# Patient Record
Sex: Female | Born: 1995 | Race: Black or African American | Hispanic: No | Marital: Single | Smoking: Never smoker
Health system: Southern US, Community
[De-identification: ages and names within clinical notes are randomized; demographics above are authoritative.]

## PROBLEM LIST (undated history)

## (undated) DIAGNOSIS — E0500 Thyrotoxicosis with diffuse goiter without thyrotoxic crisis or storm: Secondary | ICD-10-CM

## (undated) DIAGNOSIS — R251 Tremor, unspecified: Secondary | ICD-10-CM

## (undated) DIAGNOSIS — R000 Tachycardia, unspecified: Secondary | ICD-10-CM

## (undated) DIAGNOSIS — E063 Autoimmune thyroiditis: Secondary | ICD-10-CM

## (undated) DIAGNOSIS — E0590 Thyrotoxicosis, unspecified without thyrotoxic crisis or storm: Secondary | ICD-10-CM

## (undated) DIAGNOSIS — R634 Abnormal weight loss: Secondary | ICD-10-CM

## (undated) DIAGNOSIS — R1013 Epigastric pain: Secondary | ICD-10-CM

## (undated) DIAGNOSIS — E039 Hypothyroidism, unspecified: Secondary | ICD-10-CM

## (undated) DIAGNOSIS — R454 Irritability and anger: Secondary | ICD-10-CM

## (undated) HISTORY — PX: NO PAST SURGERIES: SHX2092

## (undated) HISTORY — DX: Tremor, unspecified: R25.1

## (undated) HISTORY — DX: Tachycardia, unspecified: R00.0

## (undated) HISTORY — DX: Abnormal weight loss: R63.4

## (undated) HISTORY — DX: Irritability and anger: R45.4

## (undated) HISTORY — DX: Epigastric pain: R10.13

## (undated) HISTORY — DX: Autoimmune thyroiditis: E06.3

## (undated) HISTORY — DX: Thyrotoxicosis with diffuse goiter without thyrotoxic crisis or storm: E05.00

---
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
## 2013-01-15 ENCOUNTER — Ambulatory Visit (INDEPENDENT_AMBULATORY_CARE_PROVIDER_SITE_OTHER): Payer: BC Managed Care – PPO | Admitting: Physician Assistant

## 2013-01-15 VITALS — BP 106/69 | HR 64 | Temp 97.7°F | Resp 16 | Ht 62.5 in | Wt 124.0 lb

## 2013-01-15 DIAGNOSIS — Z0000 Encounter for general adult medical examination without abnormal findings: Secondary | ICD-10-CM

## 2013-01-15 DIAGNOSIS — R300 Dysuria: Secondary | ICD-10-CM

## 2013-01-15 LAB — POCT URINALYSIS DIPSTICK
Bilirubin, UA: NEGATIVE
Blood, UA: NEGATIVE
Glucose, UA: NEGATIVE
Ketones, UA: NEGATIVE
Leukocytes, UA: NEGATIVE
Nitrite, UA: NEGATIVE
Protein, UA: NEGATIVE
Spec Grav, UA: 1.025
Urobilinogen, UA: 0.2
pH, UA: 5.5

## 2013-01-15 LAB — POCT UA - MICROSCOPIC ONLY
Bacteria, U Microscopic: NEGATIVE
Casts, Ur, LPF, POC: NEGATIVE
Crystals, Ur, HPF, POC: NEGATIVE
Yeast, UA: NEGATIVE

## 2013-01-15 NOTE — Progress Notes (Signed)
  Subjective:    Patient ID: Angela Meyer, female    DOB: 07-31-1995, 17 y.o.   MRN: [id number]  HPI 17 year old female presents for CPE.  She needs this for school but does not have any paperwork that she needs completed.  States she received all of her vaccinations needed last year and only needs to "have gotten a physical."  Does see her Endocrinologist regularly for Grave's disease and DM x 1 - doing well.  Recently saw her GYN 2 weeks ago and had a pap as well as her depo injection - found out that she had Chlamydia and was treated.  Now is having some suprapubic cramping.  Is not currently sexually active and already has an appointment to f/u with GYN for TOC.   Otherwise doing well with no other concerns today.     Review of Systems  Constitutional: Negative for fever and chills.  Gastrointestinal: Positive for abdominal pain (suprapubic). Negative for nausea and vomiting.  Genitourinary: Positive for dysuria. Negative for frequency and vaginal discharge.  All other systems reviewed and are negative.       Objective:   Physical Exam  Constitutional: She is oriented to person, place, and time. She appears well-developed and well-nourished.  HENT:  Head: Normocephalic and atraumatic.  Right Ear: Hearing, tympanic membrane, external ear and ear canal normal.  Left Ear: Hearing, tympanic membrane, external ear and ear canal normal.  Mouth/Throat: Uvula is midline, oropharynx is clear and moist and mucous membranes are normal.  Eyes: Conjunctivae and EOM are normal. Pupils are equal, round, and reactive to light.  Neck: Thyromegaly present.  Cardiovascular: Normal rate, regular rhythm and normal heart sounds.   Pulmonary/Chest: Effort normal and breath sounds normal.  Neurological: She is alert and oriented to person, place, and time.  Psychiatric: She has a normal mood and affect. Her behavior is normal. Judgment and thought content normal.    Patient declined pelvic exam.     Results for orders placed in visit on 01/15/13  POCT UA - MICROSCOPIC ONLY      Result Value Range   WBC, Ur, HPF, POC 0-6     RBC, urine, microscopic 0-2     Bacteria, U Microscopic neg     Mucus, UA trace     Epithelial cells, urine per micros 0-6     Crystals, Ur, HPF, POC neg     Casts, Ur, LPF, POC neg     Yeast, UA neg    POCT URINALYSIS DIPSTICK      Result Value Range   Color, UA yellow     Clarity, UA clear     Glucose, UA neg     Bilirubin, UA neg     Ketones, UA neg     Spec Grav, UA 1.025     Blood, UA neg     pH, UA 5.5     Protein, UA neg     Urobilinogen, UA 0.2     Nitrite, UA neg     Leukocytes, UA Negative         Assessment & Plan:  Routine general medical examination at a health care facility  Dysuria - Plan: POCT UA - Microscopic Only, POCT urinalysis dipstick, Urine culture  Urine culture pending although UA clear today Recommend ibuprofen for abdominal cramping - follow up with GYN as planned if symptoms persist Letter printed for her to submit to school.  Follow up as needed.

## 2013-01-15 NOTE — Progress Notes (Signed)
  Subjective:    Patient ID: Angela Meyer, female    DOB: 1996-05-18, 17 y.o.   MRN: [id number]  HPI    Review of Systems  Gastrointestinal: Positive for abdominal pain.  Neurological: Positive for headaches.       Objective:   Physical Exam        Assessment & Plan:

## 2013-01-16 LAB — URINE CULTURE: Colony Count: 15000

## 2014-01-29 ENCOUNTER — Encounter (HOSPITAL_COMMUNITY): Payer: Self-pay | Admitting: Emergency Medicine

## 2014-01-29 ENCOUNTER — Emergency Department (HOSPITAL_COMMUNITY): Payer: BC Managed Care – PPO

## 2014-01-29 ENCOUNTER — Inpatient Hospital Stay (HOSPITAL_COMMUNITY)
Admission: EM | Admit: 2014-01-29 | Discharge: 2014-02-03 | DRG: 638 | Disposition: A | Discharge status: Home / Self Care | Payer: BC Managed Care – PPO | Attending: Internal Medicine | Admitting: Internal Medicine

## 2014-01-29 DIAGNOSIS — N10 Acute tubulo-interstitial nephritis: Secondary | ICD-10-CM | POA: Diagnosis present

## 2014-01-29 DIAGNOSIS — Z8249 Family history of ischemic heart disease and other diseases of the circulatory system: Secondary | ICD-10-CM | POA: Diagnosis not present

## 2014-01-29 DIAGNOSIS — N390 Urinary tract infection, site not specified: Secondary | ICD-10-CM

## 2014-01-29 DIAGNOSIS — A499 Bacterial infection, unspecified: Secondary | ICD-10-CM

## 2014-01-29 DIAGNOSIS — E1110 Type 2 diabetes mellitus with ketoacidosis without coma: Secondary | ICD-10-CM | POA: Diagnosis present

## 2014-01-29 DIAGNOSIS — E1010 Type 1 diabetes mellitus with ketoacidosis without coma: Principal | ICD-10-CM

## 2014-01-29 DIAGNOSIS — E860 Dehydration: Secondary | ICD-10-CM

## 2014-01-29 DIAGNOSIS — E063 Autoimmune thyroiditis: Secondary | ICD-10-CM | POA: Diagnosis present

## 2014-01-29 DIAGNOSIS — E0590 Thyrotoxicosis, unspecified without thyrotoxic crisis or storm: Secondary | ICD-10-CM

## 2014-01-29 DIAGNOSIS — R109 Unspecified abdominal pain: Secondary | ICD-10-CM | POA: Diagnosis not present

## 2014-01-29 DIAGNOSIS — R000 Tachycardia, unspecified: Secondary | ICD-10-CM | POA: Diagnosis present

## 2014-01-29 DIAGNOSIS — B9689 Other specified bacterial agents as the cause of diseases classified elsewhere: Secondary | ICD-10-CM | POA: Diagnosis present

## 2014-01-29 DIAGNOSIS — R7881 Bacteremia: Secondary | ICD-10-CM

## 2014-01-29 DIAGNOSIS — IMO0002 Reserved for concepts with insufficient information to code with codable children: Secondary | ICD-10-CM

## 2014-01-29 DIAGNOSIS — A498 Other bacterial infections of unspecified site: Secondary | ICD-10-CM | POA: Diagnosis present

## 2014-01-29 DIAGNOSIS — E1065 Type 1 diabetes mellitus with hyperglycemia: Secondary | ICD-10-CM

## 2014-01-29 DIAGNOSIS — Z833 Family history of diabetes mellitus: Secondary | ICD-10-CM | POA: Diagnosis not present

## 2014-01-29 DIAGNOSIS — N12 Tubulo-interstitial nephritis, not specified as acute or chronic: Secondary | ICD-10-CM | POA: Diagnosis present

## 2014-01-29 DIAGNOSIS — K141 Geographic tongue: Secondary | ICD-10-CM

## 2014-01-29 LAB — BASIC METABOLIC PANEL
ANION GAP: 19 — AB (ref 5–15)
Anion gap: 16 — ABNORMAL HIGH (ref 5–15)
BUN: 5 mg/dL — ABNORMAL LOW (ref 6–23)
BUN: 5 mg/dL — ABNORMAL LOW (ref 6–23)
CALCIUM: 8.7 mg/dL (ref 8.4–10.5)
CALCIUM: 8.3 mg/dL — AB (ref 8.4–10.5)
CO2: 15 mEq/L — ABNORMAL LOW (ref 19–32)
CO2: 16 mEq/L — ABNORMAL LOW (ref 19–32)
CREATININE: 0.5 mg/dL (ref 0.50–1.10)
CREATININE: 0.51 mg/dL (ref 0.50–1.10)
Chloride: 99 mEq/L (ref 96–112)
Chloride: 101 mEq/L (ref 96–112)
GFR calc non Af Amer: >90 mL/min (ref >90)
GFR calc non Af Amer: >90 mL/min (ref >90)
Glucose, Bld: 298 mg/dL — ABNORMAL HIGH (ref 70–99)
Glucose, Bld: 218 mg/dL — ABNORMAL HIGH (ref 70–99)
Potassium: 3.4 mEq/L — ABNORMAL LOW (ref 3.7–5.3)
Potassium: 3.4 mEq/L — ABNORMAL LOW (ref 3.7–5.3)
Sodium: 133 mEq/L — ABNORMAL LOW (ref 137–147)
Sodium: 133 mEq/L — ABNORMAL LOW (ref 137–147)

## 2014-01-29 LAB — CBC WITH DIFFERENTIAL/PLATELET
BASOS PCT: 0 % (ref 0–1)
Basophils Absolute: 0 10*3/uL (ref 0.0–0.1)
EOS ABS: 0 10*3/uL (ref 0.0–0.7)
Eosinophils Relative: 0 % (ref 0–5)
HEMATOCRIT: 41 % (ref 36.0–46.0)
Hemoglobin: 13.9 g/dL (ref 12.0–15.0)
Lymphocytes Relative: 4 % — ABNORMAL LOW (ref 12–46)
Lymphs Abs: 0.6 10*3/uL — ABNORMAL LOW (ref 0.7–4.0)
MCH: 28.4 pg (ref 26.0–34.0)
MCHC: 33.9 g/dL (ref 30.0–36.0)
MCV: 83.7 fL (ref 78.0–100.0)
MONO ABS: 2 10*3/uL — AB (ref 0.1–1.0)
Monocytes Relative: 15 % — ABNORMAL HIGH (ref 3–12)
Neutro Abs: 10.4 10*3/uL — ABNORMAL HIGH (ref 1.7–7.7)
Neutrophils Relative %: 81 % — ABNORMAL HIGH (ref 43–77)
Platelets: 265 10*3/uL (ref 150–400)
RBC: 4.9 MIL/uL (ref 3.87–5.11)
RDW: 12.8 % (ref 11.5–15.5)
WBC: 13 10*3/uL — ABNORMAL HIGH (ref 4.0–10.5)

## 2014-01-29 LAB — GLUCOSE, CAPILLARY
GLUCOSE-CAPILLARY: 216 mg/dL — AB (ref 70–99)
Glucose-Capillary: 185 mg/dL — ABNORMAL HIGH (ref 70–99)
Glucose-Capillary: 250 mg/dL — ABNORMAL HIGH (ref 70–99)
Glucose-Capillary: 327 mg/dL — ABNORMAL HIGH (ref 70–99)

## 2014-01-29 LAB — I-STAT VENOUS BLOOD GAS, ED
Acid-base deficit: 9 mmol/L — ABNORMAL HIGH (ref 0.0–2.0)
Bicarbonate: 15.3 mEq/L — ABNORMAL LOW (ref 20.0–24.0)
O2 SAT: 80 %
PCO2 VEN: 29.4 mmHg — AB (ref 45.0–50.0)
PH VEN: 7.325 — AB (ref 7.250–7.300)
TCO2: 16 mmol/L (ref 0–100)
pO2, Ven: 47 mmHg — ABNORMAL HIGH (ref 30.0–45.0)

## 2014-01-29 LAB — COMPREHENSIVE METABOLIC PANEL
ALBUMIN: 4 g/dL (ref 3.5–5.2)
ALT: 13 U/L (ref 0–35)
AST: 14 U/L (ref 0–37)
Alkaline Phosphatase: 80 U/L (ref 39–117)
Anion gap: 24 — ABNORMAL HIGH (ref 5–15)
BUN: 6 mg/dL (ref 6–23)
CHLORIDE: 93 meq/L — AB (ref 96–112)
CO2: 17 mEq/L — ABNORMAL LOW (ref 19–32)
CREATININE: 0.65 mg/dL (ref 0.50–1.10)
Calcium: 9.5 mg/dL (ref 8.4–10.5)
GFR calc Af Amer: >90 mL/min (ref >90)
GFR calc non Af Amer: >90 mL/min (ref >90)
Glucose, Bld: 268 mg/dL — ABNORMAL HIGH (ref 70–99)
Potassium: 3.8 mEq/L (ref 3.7–5.3)
Sodium: 134 mEq/L — ABNORMAL LOW (ref 137–147)
TOTAL PROTEIN: 8.2 g/dL (ref 6.0–8.3)
Total Bilirubin: 0.7 mg/dL (ref 0.3–1.2)

## 2014-01-29 LAB — CBC
HCT: 36 % (ref 36.0–46.0)
HEMATOCRIT: 33 % — AB (ref 36.0–46.0)
Hemoglobin: 12.2 g/dL (ref 12.0–15.0)
Hemoglobin: 11.2 g/dL — ABNORMAL LOW (ref 12.0–15.0)
MCH: 28.2 pg (ref 26.0–34.0)
MCH: 27.6 pg (ref 26.0–34.0)
MCHC: 33.9 g/dL (ref 30.0–36.0)
MCHC: 33.9 g/dL (ref 30.0–36.0)
MCV: 83.1 fL (ref 78.0–100.0)
MCV: 81.3 fL (ref 78.0–100.0)
Platelets: 228 10*3/uL (ref 150–400)
Platelets: 228 10*3/uL (ref 150–400)
RBC: 4.33 MIL/uL (ref 3.87–5.11)
RBC: 4.06 MIL/uL (ref 3.87–5.11)
RDW: 12.8 % (ref 11.5–15.5)
RDW: 12.6 % (ref 11.5–15.5)
WBC: 9.4 10*3/uL (ref 4.0–10.5)
WBC: 10.1 10*3/uL (ref 4.0–10.5)

## 2014-01-29 LAB — BLOOD GAS, VENOUS
Acid-base deficit: 8.3 mmol/L — ABNORMAL HIGH (ref 0.0–2.0)
Bicarbonate: 15.8 mEq/L — ABNORMAL LOW (ref 20.0–24.0)
Drawn by: 22112
O2 SAT: 98.3 %
PCO2 VEN: 27.2 mmHg — AB (ref 45.0–50.0)
Patient temperature: 98.6
TCO2: 16.6 mmol/L (ref 0–100)
pH, Ven: 7.382 — ABNORMAL HIGH (ref 7.250–7.300)
pO2, Ven: 108 mmHg — ABNORMAL HIGH (ref 30.0–45.0)

## 2014-01-29 LAB — URINALYSIS, ROUTINE W REFLEX MICROSCOPIC
Bilirubin Urine: NEGATIVE
Ketones, ur: >80 mg/dL — AB
NITRITE: POSITIVE — AB
PH: 5.5 (ref 5.0–8.0)
Protein, ur: 100 mg/dL — AB
Specific Gravity, Urine: 1.03 (ref 1.005–1.030)
Urobilinogen, UA: 0.2 mg/dL (ref 0.0–1.0)

## 2014-01-29 LAB — URINE MICROSCOPIC-ADD ON

## 2014-01-29 LAB — I-STAT CG4 LACTIC ACID, ED: Lactic Acid, Venous: 1.96 mmol/L (ref 0.5–2.2)

## 2014-01-29 LAB — POC URINE PREG, ED: Preg Test, Ur: NEGATIVE

## 2014-01-29 LAB — MRSA PCR SCREENING: MRSA by PCR: NEGATIVE

## 2014-01-29 MED ORDER — DEXTROSE-NACL 5-0.45 % IV SOLN
INTRAVENOUS | Status: DC
  Administered 2014-01-30 (×2): via INTRAVENOUS

## 2014-01-29 MED ORDER — IBUPROFEN 400 MG PO TABS
600.0000 mg | ORAL_TABLET | Freq: Once | ORAL | Status: AC
  Administered 2014-01-29: 600 mg via ORAL
  Filled 2014-01-29 (×2): qty 1

## 2014-01-29 MED ORDER — POTASSIUM CHLORIDE 10 MEQ/100ML IV SOLN
10.0000 meq | INTRAVENOUS | Status: AC
  Administered 2014-01-29 (×2): 10 meq via INTRAVENOUS
  Filled 2014-01-29: qty 100

## 2014-01-29 MED ORDER — SODIUM CHLORIDE 0.9 % IV SOLN
INTRAVENOUS | Status: AC
  Administered 2014-01-29: 1.6 [IU]/h via INTRAVENOUS
  Administered 2014-01-30: 3.2 [IU]/h via INTRAVENOUS
  Administered 2014-01-30: 4.8 [IU]/h via INTRAVENOUS
  Administered 2014-01-30: 4.4 [IU]/h via INTRAVENOUS
  Filled 2014-01-29 (×2): qty 1

## 2014-01-29 MED ORDER — ACETAMINOPHEN 325 MG PO TABS
650.0000 mg | ORAL_TABLET | Freq: Four times a day (QID) | ORAL | Status: DC | PRN
  Administered 2014-01-29 – 2014-02-01 (×8): 650 mg via ORAL
  Filled 2014-01-29 (×8): qty 2

## 2014-01-29 MED ORDER — SODIUM CHLORIDE 0.9 % IV SOLN
INTRAVENOUS | Status: DC
  Administered 2014-01-29: 125 mL/h via INTRAVENOUS

## 2014-01-29 MED ORDER — SODIUM CHLORIDE 0.9 % IV SOLN: INTRAVENOUS | Status: DC

## 2014-01-29 MED ORDER — HEPARIN SODIUM (PORCINE) 5000 UNIT/ML IJ SOLN
5000.0000 [IU] | Freq: Three times a day (TID) | INTRAMUSCULAR | Status: DC
  Administered 2014-01-29 – 2014-02-02 (×12): 5000 [IU] via SUBCUTANEOUS
  Filled 2014-01-29 (×16): qty 1

## 2014-01-29 MED ORDER — DEXTROSE 5 % IV SOLN
1.0000 g | INTRAVENOUS | Status: DC
  Administered 2014-01-30: 1 g via INTRAVENOUS
  Filled 2014-01-29 (×2): qty 10

## 2014-01-29 MED ORDER — DEXTROSE-NACL 5-0.45 % IV SOLN
INTRAVENOUS | Status: DC
  Administered 2014-01-29: 75 mL/h via INTRAVENOUS

## 2014-01-29 MED ORDER — ACETAMINOPHEN 325 MG PO TABS
325.0000 mg | ORAL_TABLET | Freq: Once | ORAL | Status: AC
  Administered 2014-01-29: 325 mg via ORAL
  Filled 2014-01-29: qty 1

## 2014-01-29 MED ORDER — SODIUM CHLORIDE 0.9 % IV SOLN
Freq: Once | INTRAVENOUS | Status: AC
  Administered 2014-01-29: 15:00:00 via INTRAVENOUS

## 2014-01-29 MED ORDER — DEXTROSE 5 % IV SOLN
1.0000 g | Freq: Once | INTRAVENOUS | Status: AC
  Administered 2014-01-29: 1 g via INTRAVENOUS
  Filled 2014-01-29: qty 10

## 2014-01-29 MED ORDER — DEXTROSE 50 % IV SOLN: 25.0000 mL | INTRAVENOUS | Status: DC | PRN

## 2014-01-29 MED ORDER — ONDANSETRON HCL 4 MG/2ML IJ SOLN
4.0000 mg | Freq: Once | INTRAMUSCULAR | Status: AC
  Administered 2014-01-29: 4 mg via INTRAVENOUS
  Filled 2014-01-29: qty 2

## 2014-01-29 NOTE — ED Notes (Signed)
Admitting Physician at the bedside.  

## 2014-01-29 NOTE — H&P (Addendum)
[facility] History and Physical  Angela A Potash JJO:[id number] DOB: 1996/03/12 DOA: 01/29/2014  Referring physician: EDP PCP: Hoyle SauerAVVA,RAVISANKAR R, MD   Chief Complaint: N/V   HPI: Angela Meyer is a 18 y.o. female h/o DM1, presents to the ED with N/V and abdominal pain.  Symptoms onset 3 days ago.  She has had multiple episodes of vomiting, vomit is NBNB.  She reports that her BGLs have become elevated over the last couple of days despite no changes to medications and she dosent understand why as they have been well controlled for the past 6 months.  Symptoms associated with abdominal pain and fever.  Review of Systems: Systems reviewed.  As above, otherwise negative  Past Medical History  Diagnosis Date  . Goiter with hyperthyroidism   . Weight loss, abnormal   . Fetal tachycardia before the onset of labor   . Tremor   . Thyroiditis, autoimmune   . Dyspepsia   . Irritability   . Thyrotoxicosis with diffuse goiter   . Chronic autoimmune thyroiditis   . Tachycardia   . Tremor   . Weight loss   . Irritability   . Diabetes mellitus    History reviewed. No pertinent past surgical history. Social History:  reports that she has never smoked. She has never used smokeless tobacco. She reports that she does not drink alcohol or use illicit drugs.  No Known Allergies  Family History  Problem Relation Age of Onset  . Diabetes Father   . Diabetes Paternal Aunt   . Thyroid disease Maternal Grandmother   . Heart disease Maternal Grandfather      Prior to Admission medications   Medication Sig Start Date End Date Taking? Authorizing Provider  Insulin Aspart (NOVOLOG FLEXPEN [place]) Inject 0-15 Units into the skin 4 (four) times daily as needed (blood sugar).    Yes Historical Provider, MD  insulin glargine (LANTUS) 100 UNIT/ML injection Inject 14 Units into the skin at bedtime.    Yes Historical Provider, MD  medroxyPROGESTERone (DEPO-PROVERA) 150 MG/ML injection Inject 150 mg  into the muscle every 3 (three) months.   Yes Historical Provider, MD   Physical Exam: Filed Vitals:   01/29/14 1815  BP: 116/77  Pulse:   Temp:   Resp: 13    BP 116/77  Pulse 108  Temp(Src) 99.5 F (37.5 C) (Oral)  Resp 13  Ht 5\' 2"  (1.575 m)  Wt 54.432 kg (120 lb)  BMI 21.94 kg/m2  SpO2 100%  General Appearance:    Alert, oriented, no distress, appears stated age  Head:    Normocephalic, atraumatic  Eyes:    PERRL, EOMI, sclera non-icteric        Nose:   Nares without drainage or epistaxis. Mucosa, turbinates normal  Throat:   Moist mucous membranes. Oropharynx without erythema or exudate.  Neck:   Supple. No carotid bruits.  No thyromegaly.  No lymphadenopathy.   Back:     No CVA tenderness, no spinal tenderness  Lungs:     Clear to auscultation bilaterally, without wheezes, rhonchi or rales  Chest wall:    No tenderness to palpitation  Heart:    Regular rate and rhythm without murmurs, gallops, rubs  Abdomen:     Soft, non-tender, nondistended, normal bowel sounds, no organomegaly  Genitalia:    deferred  Rectal:    deferred  Extremities:   No clubbing, cyanosis or edema.  Pulses:   2+ and symmetric all extremities  Skin:   Skin color,  texture, turgor normal, no rashes or lesions  Lymph nodes:   Cervical, supraclavicular, and axillary nodes normal  Neurologic:   CNII-XII intact. Normal strength, sensation and reflexes      throughout    Labs on Admission:  Basic Metabolic Panel:  Recent Labs Lab 01/29/14 1510  NA 134*  K 3.8  CL 93*  CO2 17*  GLUCOSE 268*  BUN 6  CREATININE 0.65  CALCIUM 9.5   Liver Function Tests:  Recent Labs Lab 01/29/14 1510  AST 14  ALT 13  ALKPHOS 80  BILITOT 0.7  PROT 8.2  ALBUMIN 4.0   No results found for this basename: LIPASE, AMYLASE,  in the last 168 hours No results found for this basename: AMMONIA,  in the last 168 hours CBC:  Recent Labs Lab 01/29/14 1510  WBC 13.0*  NEUTROABS 10.4*  HGB 13.9  HCT 41.0   MCV 83.7  PLT 265   Cardiac Enzymes: No results found for this basename: CKTOTAL, CKMB, CKMBINDEX, TROPONINI,  in the last 168 hours  BNP (last 3 results) No results found for this basename: PROBNP,  in the last 8760 hours CBG: No results found for this basename: GLUCAP,  in the last 168 hours  Radiological Exams on Admission: Dg Chest 2 View  01/29/2014   CLINICAL DATA:  Abdominal pain. Nausea and vomiting. Fever. Possible arrhythmia. Diabetic.  EXAM: CHEST  2 VIEW  COMPARISON:  03/29/2007  FINDINGS: The heart size and mediastinal contours are within normal limits. Both lungs are clear. The visualized skeletal structures are unremarkable.  IMPRESSION: No active cardiopulmonary disease.  Improved appearance from priors.   Electronically Signed   By: Davonna Belling M.D.   On: 01/29/2014 15:39    EKG: Independently reviewed.  Assessment/Plan Principal Problem:   DKA, type 1 Active Problems:   Diabetes type 1, uncontrolled   UTI (lower urinary tract infection)     1. DKA type 1 - DKA occuring secondary to increased insulin requirement in setting of UTI.  On DKA pathway, insulin gtt per protocol, replace lytes, labs per protocol.  Explained to patient about how UTI was causing increased insulin requirements and her to go into DKA. 2. UTI - rocephin for UTI, tylenol for fever, urine culture pending.  Monitor tachycardia on tele monitor.    Code Status: Full Code  Family Communication: Family at bedside Disposition Plan: Admit to inpatient   Time spent: 70 min  Daja Shuping M. [facility] Pager [phone]  If 7AM-7PM, please contact the day team taking care of the patient [email or web address] Password [email or web address] 01/29/2014, 7:25 PM

## 2014-01-29 NOTE — ED Notes (Signed)
Pt c/o pain in abdomen with N/V onset Friday. Pt reports history of diabetes. Pt reports fever at home. Pt denies change in diet.

## 2014-01-29 NOTE — ED Notes (Signed)
Attempted to call report x 1  

## 2014-01-29 NOTE — ED Notes (Signed)
Lab results given to EDP. 

## 2014-01-29 NOTE — ED Notes (Signed)
CBG 217  

## 2014-01-29 NOTE — ED Provider Notes (Signed)
Medical screening examination/treatment/procedure(s) were conducted as a shared visit with non-physician practitioner(s) and myself.  I personally evaluated the patient during the encounter.  CRITICAL CARE Performed by: Lyanne CoAMPOS,Beda Dula M Total critical care time: 30 Critical care time was exclusive of separately billable procedures and treating other patients. Critical care was necessary to treat or prevent imminent or life-threatening deterioration. Critical care was time spent personally by me on the following activities: development of treatment plan with patient and/or surrogate as well as nursing, discussions with consultants, evaluation of patient's response to treatment, examination of patient, obtaining history from patient or surrogate, ordering and performing treatments and interventions, ordering and review of laboratory studies, ordering and review of radiographic studies, pulse oximetry and re-evaluation of patient's condition.  Pt will be admitted for UTI and DKA. Will start on insulin gtt at this time. Urine culture pending. Admit to step down   Lyanne CoKevin M Silvestre [place], MD 01/29/14 [phone]

## 2014-01-29 NOTE — ED Provider Notes (Signed)
CSN: [id number]     Arrival date & time 01/29/14  1422 History   First MD Initiated Contact with Patient 01/29/14 1455     Chief Complaint  Patient presents with  . Abdominal Pain  . Emesis     (Consider location/radiation/quality/duration/timing/severity/associated sxs/prior Treatment) Patient is a 18 y.o. female presenting with vomiting. The history is provided by the patient. No language interpreter was used.  Emesis Severity:  Moderate Duration:  3 days Timing:  Constant Number of daily episodes:  Multiple Quality:  Undigested food Able to tolerate:  Liquids Progression:  Worsening Chronicity:  New Recent urination:  Normal Relieved by:  Nothing Worsened by:  Nothing tried Ineffective treatments:  None tried Associated symptoms: abdominal pain and fever   Associated symptoms: no diarrhea   Risk factors: diabetes   Risk factors: no alcohol use   Pt has a history of diabetes.   Pt has had recent increased urination.   Pt reports fever and vomitting today.  No vaginal discharge no gyn problems.    Past Medical History  Diagnosis Date  . Goiter with hyperthyroidism   . Weight loss, abnormal   . Fetal tachycardia before the onset of labor   . Tremor   . Thyroiditis, autoimmune   . Dyspepsia   . Irritability   . Thyrotoxicosis with diffuse goiter   . Chronic autoimmune thyroiditis   . Tachycardia   . Tremor   . Weight loss   . Irritability   . Diabetes mellitus    History reviewed. No pertinent past surgical history. Family History  Problem Relation Age of Onset  . Diabetes Father   . Diabetes Paternal Aunt   . Thyroid disease Maternal Grandmother   . Heart disease Maternal Grandfather    History  Substance Use Topics  . Smoking status: Never Smoker   . Smokeless tobacco: Never Used  . Alcohol Use: No   OB History   Grav Para Term Preterm Abortions TAB SAB Ect Mult Living                 Review of Systems  Gastrointestinal: Positive for vomiting and  abdominal pain. Negative for diarrhea.  All other systems reviewed and are negative.     Allergies  Review of patient's allergies indicates no known allergies.  Home Medications   Prior to Admission medications   Medication Sig Start Date End Date Taking? Authorizing Provider  Insulin Aspart (NOVOLOG FLEXPEN [place]) Inject 0-15 Units into the skin 4 (four) times daily as needed (blood sugar).    Yes Historical Provider, MD  insulin glargine (LANTUS) 100 UNIT/ML injection Inject 14 Units into the skin at bedtime.    Yes Historical Provider, MD  medroxyPROGESTERone (DEPO-PROVERA) 150 MG/ML injection Inject 150 mg into the muscle every 3 (three) months.   Yes Historical Provider, MD   BP 121/76  Pulse 108  Temp(Src) 99.5 F (37.5 C) (Oral)  Resp 26  Ht 5\' 2"  (1.575 m)  Wt 120 lb (54.432 kg)  BMI 21.94 kg/m2  SpO2 100% Physical Exam  Nursing note and vitals reviewed. Constitutional: She appears well-developed and well-nourished.  HENT:  Head: Normocephalic.  Right Ear: External ear normal.  Nose: Nose normal.  Mouth/Throat: Oropharynx is clear and moist.  Eyes: EOM are normal. Pupils are equal, round, and reactive to light.  Neck: Neck supple.  Cardiovascular: Normal rate and normal heart sounds.   Pulmonary/Chest: Effort normal.  Abdominal: Soft.  Musculoskeletal: Normal range of motion.  Skin: Skin  is warm.  Psychiatric: She has a normal mood and affect.    ED Course  Procedures (including critical care time) Labs Review Labs Reviewed  CBC WITH DIFFERENTIAL - Abnormal; Notable for the following:    WBC 13.0 (*)    Neutrophils Relative % 81 (*)    Neutro Abs 10.4 (*)    Lymphocytes Relative 4 (*)    Lymphs Abs 0.6 (*)    Monocytes Relative 15 (*)    Monocytes Absolute 2.0 (*)    All other components within normal limits  COMPREHENSIVE METABOLIC PANEL - Abnormal; Notable for the following:    Sodium 134 (*)    Chloride 93 (*)    CO2 17 (*)    Glucose, Bld 268 (*)     Anion gap 24 (*)    All other components within normal limits  URINALYSIS, ROUTINE W REFLEX MICROSCOPIC - Abnormal; Notable for the following:    APPearance CLOUDY (*)    Glucose, UA >1000 (*)    Hgb urine dipstick LARGE (*)    Ketones, ur >80 (*)    Protein, ur 100 (*)    Nitrite POSITIVE (*)    Leukocytes, UA SMALL (*)    All other components within normal limits  URINE MICROSCOPIC-ADD ON - Abnormal; Notable for the following:    Squamous Epithelial / LPF FEW (*)    Bacteria, UA FEW (*)    All other components within normal limits  CULTURE, BLOOD (ROUTINE X 2)  CULTURE, BLOOD (ROUTINE X 2)  URINE CULTURE  I-STAT CG4 LACTIC ACID, ED  POC URINE PREG, ED    Imaging Review Dg Chest 2 View  01/29/2014   CLINICAL DATA:  Abdominal pain. Nausea and vomiting. Fever. Possible arrhythmia. Diabetic.  EXAM: CHEST  2 VIEW  COMPARISON:  03/29/2007  FINDINGS: The heart size and mediastinal contours are within normal limits. Both lungs are clear. The visualized skeletal structures are unremarkable.  IMPRESSION: No active cardiopulmonary disease.  Improved appearance from priors.   Electronically Signed   By: Davonna BellingJohn  Curnes M.D.   On: 01/29/2014 15:39     EKG Interpretation None      MDM   Final diagnoses:  None    Pt given Iv fluids,  Rocephin Iv.  Lactic acid returned at 1.96.  Ua shows ketones 80 .    Pt is nitrate positive with 21-50 wbc's  Wbc 13.0  Cmet shows a sodioum of 134  Cl 93 and Co2 of 17.   Anion gap of 24.     Pt reports decreased nausea. Temp decreased with tylenol.      Pt seen by Dr. Patria Maneampos.   Pt started on D5 and insulin.     I spoke to Dr. Julian ReilGardner who will admit.    Lonia SkinnerLeslie K MaywoodSofia, PA-C 01/29/14 1914

## 2014-01-29 NOTE — ED Notes (Signed)
Transporting patient up to 3S.

## 2014-01-30 DIAGNOSIS — R7881 Bacteremia: Secondary | ICD-10-CM | POA: Diagnosis present

## 2014-01-30 DIAGNOSIS — N12 Tubulo-interstitial nephritis, not specified as acute or chronic: Secondary | ICD-10-CM | POA: Diagnosis present

## 2014-01-30 LAB — BASIC METABOLIC PANEL
ANION GAP: 13 (ref 5–15)
ANION GAP: 12 (ref 5–15)
ANION GAP: 16 — AB (ref 5–15)
Anion gap: 14 (ref 5–15)
Anion gap: 14 (ref 5–15)
Anion gap: 13 (ref 5–15)
BUN: 4 mg/dL — ABNORMAL LOW (ref 6–23)
BUN: 4 mg/dL — ABNORMAL LOW (ref 6–23)
BUN: 3 mg/dL — ABNORMAL LOW (ref 6–23)
BUN: 3 mg/dL — ABNORMAL LOW (ref 6–23)
BUN: 3 mg/dL — ABNORMAL LOW (ref 6–23)
BUN: 3 mg/dL — ABNORMAL LOW (ref 6–23)
CALCIUM: 8.4 mg/dL (ref 8.4–10.5)
CALCIUM: 8 mg/dL — AB (ref 8.4–10.5)
CHLORIDE: 102 meq/L (ref 96–112)
CHLORIDE: 100 meq/L (ref 96–112)
CO2: 19 meq/L (ref 19–32)
CO2: 20 mEq/L (ref 19–32)
CO2: 18 mEq/L — ABNORMAL LOW (ref 19–32)
CO2: 19 mEq/L (ref 19–32)
CO2: 19 mEq/L (ref 19–32)
CO2: 18 mEq/L — ABNORMAL LOW (ref 19–32)
CREATININE: 0.57 mg/dL (ref 0.50–1.10)
CREATININE: 0.62 mg/dL (ref 0.50–1.10)
Calcium: 8.6 mg/dL (ref 8.4–10.5)
Calcium: 8.4 mg/dL (ref 8.4–10.5)
Calcium: 8.4 mg/dL (ref 8.4–10.5)
Calcium: 8.3 mg/dL — ABNORMAL LOW (ref 8.4–10.5)
Chloride: 103 mEq/L (ref 96–112)
Chloride: 103 mEq/L (ref 96–112)
Chloride: 103 mEq/L (ref 96–112)
Chloride: 98 mEq/L (ref 96–112)
Creatinine, Ser: 0.56 mg/dL (ref 0.50–1.10)
Creatinine, Ser: 0.54 mg/dL (ref 0.50–1.10)
Creatinine, Ser: 0.58 mg/dL (ref 0.50–1.10)
Creatinine, Ser: 0.59 mg/dL (ref 0.50–1.10)
GFR calc Af Amer: >90 mL/min (ref >90)
GFR calc Af Amer: >90 mL/min (ref >90)
GFR calc Af Amer: >90 mL/min (ref >90)
GFR calc non Af Amer: >90 mL/min (ref >90)
GFR calc non Af Amer: >90 mL/min (ref >90)
GFR calc non Af Amer: >90 mL/min (ref >90)
GFR calc non Af Amer: >90 mL/min (ref >90)
GFR calc non Af Amer: >90 mL/min (ref >90)
GFR calc non Af Amer: >90 mL/min (ref >90)
Glucose, Bld: 165 mg/dL — ABNORMAL HIGH (ref 70–99)
Glucose, Bld: 113 mg/dL — ABNORMAL HIGH (ref 70–99)
Glucose, Bld: 152 mg/dL — ABNORMAL HIGH (ref 70–99)
Glucose, Bld: 168 mg/dL — ABNORMAL HIGH (ref 70–99)
Glucose, Bld: 250 mg/dL — ABNORMAL HIGH (ref 70–99)
Glucose, Bld: 189 mg/dL — ABNORMAL HIGH (ref 70–99)
POTASSIUM: 3.3 meq/L — AB (ref 3.7–5.3)
POTASSIUM: 3.2 meq/L — AB (ref 3.7–5.3)
Potassium: 3.3 mEq/L — ABNORMAL LOW (ref 3.7–5.3)
Potassium: 3.7 mEq/L (ref 3.7–5.3)
Potassium: 3.8 mEq/L (ref 3.7–5.3)
Potassium: 3 mEq/L — ABNORMAL LOW (ref 3.7–5.3)
SODIUM: 132 meq/L — AB (ref 137–147)
Sodium: 135 mEq/L — ABNORMAL LOW (ref 137–147)
Sodium: 137 mEq/L (ref 137–147)
Sodium: 135 mEq/L — ABNORMAL LOW (ref 137–147)
Sodium: 133 mEq/L — ABNORMAL LOW (ref 137–147)
Sodium: 132 mEq/L — ABNORMAL LOW (ref 137–147)

## 2014-01-30 LAB — GLUCOSE, CAPILLARY
GLUCOSE-CAPILLARY: 217 mg/dL — AB (ref 70–99)
GLUCOSE-CAPILLARY: 122 mg/dL — AB (ref 70–99)
GLUCOSE-CAPILLARY: 156 mg/dL — AB (ref 70–99)
GLUCOSE-CAPILLARY: 154 mg/dL — AB (ref 70–99)
GLUCOSE-CAPILLARY: 204 mg/dL — AB (ref 70–99)
GLUCOSE-CAPILLARY: 191 mg/dL — AB (ref 70–99)
Glucose-Capillary: 141 mg/dL — ABNORMAL HIGH (ref 70–99)
Glucose-Capillary: 145 mg/dL — ABNORMAL HIGH (ref 70–99)
Glucose-Capillary: 145 mg/dL — ABNORMAL HIGH (ref 70–99)
Glucose-Capillary: 145 mg/dL — ABNORMAL HIGH (ref 70–99)
Glucose-Capillary: 159 mg/dL — ABNORMAL HIGH (ref 70–99)
Glucose-Capillary: 162 mg/dL — ABNORMAL HIGH (ref 70–99)
Glucose-Capillary: 162 mg/dL — ABNORMAL HIGH (ref 70–99)
Glucose-Capillary: 166 mg/dL — ABNORMAL HIGH (ref 70–99)
Glucose-Capillary: 170 mg/dL — ABNORMAL HIGH (ref 70–99)
Glucose-Capillary: 171 mg/dL — ABNORMAL HIGH (ref 70–99)
Glucose-Capillary: 181 mg/dL — ABNORMAL HIGH (ref 70–99)
Glucose-Capillary: 200 mg/dL — ABNORMAL HIGH (ref 70–99)

## 2014-01-30 MED ORDER — INSULIN ASPART 100 UNIT/ML ~~LOC~~ SOLN: 0.0000 [IU] | Freq: Every day | SUBCUTANEOUS | Status: DC

## 2014-01-30 MED ORDER — POTASSIUM CHLORIDE 10 MEQ/100ML IV SOLN
10.0000 meq | INTRAVENOUS | Status: AC
  Administered 2014-01-30 – 2014-01-31 (×4): 10 meq via INTRAVENOUS
  Filled 2014-01-30 (×4): qty 100

## 2014-01-30 MED ORDER — POTASSIUM CHLORIDE 10 MEQ/100ML IV SOLN
10.0000 meq | INTRAVENOUS | Status: AC
  Administered 2014-01-30 (×3): 10 meq via INTRAVENOUS
  Filled 2014-01-30: qty 100

## 2014-01-30 MED ORDER — SODIUM CHLORIDE 0.9 % IV BOLUS (SEPSIS)
500.0000 mL | Freq: Once | INTRAVENOUS | Status: AC
  Administered 2014-01-30: 500 mL via INTRAVENOUS

## 2014-01-30 MED ORDER — KETOROLAC TROMETHAMINE 15 MG/ML IJ SOLN
15.0000 mg | Freq: Four times a day (QID) | INTRAMUSCULAR | Status: DC | PRN
  Administered 2014-01-30 – 2014-01-31 (×2): 15 mg via INTRAVENOUS
  Filled 2014-01-30 (×3): qty 1

## 2014-01-30 MED ORDER — SODIUM CHLORIDE 0.9 % IV SOLN
INTRAVENOUS | Status: DC
  Administered 2014-01-30 – 2014-01-31 (×2): via INTRAVENOUS
  Administered 2014-01-31: 100 mL via INTRAVENOUS
  Administered 2014-02-01: via INTRAVENOUS

## 2014-01-30 MED ORDER — INSULIN GLARGINE 100 UNIT/ML ~~LOC~~ SOLN: 14.0000 [IU] | Freq: Every day | SUBCUTANEOUS | Status: DC

## 2014-01-30 MED ORDER — INSULIN ASPART 100 UNIT/ML ~~LOC~~ SOLN
0.0000 [IU] | Freq: Three times a day (TID) | SUBCUTANEOUS | Status: DC
  Administered 2014-01-30: 5 [IU] via SUBCUTANEOUS

## 2014-01-30 MED ORDER — INSULIN GLARGINE 100 UNIT/ML ~~LOC~~ SOLN
14.0000 [IU] | Freq: Once | SUBCUTANEOUS | Status: AC
  Administered 2014-01-30: 14 [IU] via SUBCUTANEOUS
  Filled 2014-01-30: qty 0.14

## 2014-01-30 MED ORDER — INSULIN GLARGINE 100 UNIT/ML ~~LOC~~ SOLN
14.0000 [IU] | SUBCUTANEOUS | Status: DC
  Administered 2014-01-31 – 2014-02-01 (×2): 14 [IU] via SUBCUTANEOUS
  Filled 2014-01-30 (×2): qty 0.14

## 2014-01-30 MED ORDER — MORPHINE SULFATE 2 MG/ML IJ SOLN
2.0000 mg | Freq: Once | INTRAMUSCULAR | Status: AC
  Administered 2014-01-30: 2 mg via INTRAVENOUS
  Filled 2014-01-30: qty 1

## 2014-01-30 MED ORDER — ONDANSETRON HCL 4 MG/2ML IJ SOLN
4.0000 mg | Freq: Four times a day (QID) | INTRAMUSCULAR | Status: DC | PRN
  Administered 2014-01-30: 4 mg via INTRAVENOUS
  Filled 2014-01-30: qty 2

## 2014-01-30 NOTE — Progress Notes (Addendum)
Inpatient Diabetes Program Recommendations  AACE/ADA: New Consensus Statement on Inpatient Glycemic Control (2013)  Target Ranges:  Prepandial:   less than 140 mg/dL      Peak postprandial:   less than 180 mg/dL (1-2 hours)      Critically ill patients:  140 - 180 mg/dL   Reason for Assessment:  Type 1 diabetes/DKA  Diabetes history: Type 1 Diabetes Outpatient Diabetes medications: Lantus 14 units daily, Novolog flexpen with meals Current orders for Inpatient glycemic control:  Patient remains on IV insulin.  Consider A1C to determine pre-hospitalization glycemic control.  Will follow.  Angela MeagerJenny Dalanie Kisner, RN, BC-ADM Inpatient Diabetes Coordinator Pager [phone]   Spoke briefly to patient.  She states that her blood sugars are usually good (Less than 190 mg/dL).  She cannot remember what her last A1c was but remembers that it was good.  She see's Dr. Felipa Meyer for her diabetes.  Will follow.

## 2014-01-30 NOTE — Progress Notes (Signed)
[facility]  Angela Meyer ZOX:[id number] DOB: September 18, 1995 DOA: 01/29/2014 PCP: Hoyle Sauer, MD  Brief narrative: 18 y.o. female h/o DM1 who presented to the ED with N/V and abdominal pain. Symptoms onset 3 days prior to presentation. She has had multiple episodes of vomiting. She reported that her CBGs had become elevated over the previous several days despite no changes to medications and she did not understand why as they had been well controlled for the past 6 months. Symptoms were also associated with abdominal pain and fever.   In the ER her serum glucose was 298 with and AG of 24 and normal serum lactate. Her UA was consistent with a urinary tract infection.  HPI/Subjective: Endorsing suprapubic pain - using heating pad for relief.   Assessment/Plan:   DKA - Uncontrolled type 1 DM -etiology is underlying significant infection  -16:00 > AG closed so transition to Lantus with SSI and begin carb modified diet  Pyelonephritis -continue empiric anbxs -follow up on all cx's  Gram-negative bacteria -hemodynamically stable -see above  Hyperthyroidism / Thyroiditis, autoimmune -followed OP by Dr. Fransico Michael previously -last TSH in 2012 was >10 - records from Dr. Vicente Males office not available -consider repeat while here noting not on any thyroid replacement and in 2013 left endocrinologists office before being seen  Dehydration -cont IVFs   DVT prophylaxis: SQ Heparin Code Status: Full Family Communication: Mother at bedside Disposition Plan/Expected LOS: SDU  Consultants: None  Procedures: None  Cultures: Urine cx 8/9 >>> Blood cx x 2 8/9 >>> gram neg rods  Antibiotics: Rocephin 8/9 >>>  Objective: Blood pressure 110/71, pulse 107, temperature 101.4 F (38.6 C), temperature source Oral, resp. rate 19, height 5\' 2"  (1.575 m), weight 54.432 kg (120 lb), SpO2 99.00%.  Intake/Output Summary (Last 24 hours) at 01/30/14  1759 Last data filed at 01/30/14 1552  Gross per 24 hour  Intake 3084.52 ml  Output   2225 ml  Net 859.52 ml   Exam: Gen: No acute respiratory distress Chest: Clear to auscultation bilaterally without wheezes, rhonchi or crackles, room air Cardiac: Regular rate and rhythm, S1-S2, no rubs murmurs or gallops, no peripheral edema Abdomen: Soft tender over suprapubic area- nondistended without obvious hepatosplenomegaly, no ascites Extremities: Symmetrical in appearance without cyanosis, clubbing or effusion  Scheduled Meds:  Scheduled Meds: . cefTRIAXone (ROCEPHIN)  IV  1 g Intravenous Q24H  . heparin  5,000 Units Subcutaneous 3 times per day  . insulin aspart  0-15 Units Subcutaneous TID WC  . insulin aspart  0-5 Units Subcutaneous QHS  . [START ON 01/31/2014] insulin glargine  14 Units Subcutaneous Q24H   Data Reviewed: Basic Metabolic Panel:  Recent Labs Lab 01/30/14 0100 01/30/14 0408 01/30/14 0700 01/30/14 1341 01/30/14 1610  NA 135* 137 135* 133* 132*  K 3.3* 3.7 3.8 3.3* 3.2*  CL 103 103 103 102 100  CO2 19 20 18* 19 19  GLUCOSE 165* 113* 152* 168* 250*  BUN 4* 4* 3* 3* 3*  CREATININE 0.57 0.56 0.54 0.58 0.62  CALCIUM 8.4 8.6 8.4 8.4 8.3*   Liver Function Tests:  Recent Labs Lab 01/29/14 1510  AST 14  ALT 13  ALKPHOS 80  BILITOT 0.7  PROT 8.2  ALBUMIN 4.0   CBC:  Recent Labs Lab 01/29/14 1510 01/29/14 2056 01/29/14 2255  WBC 13.0* 9.4 10.1  NEUTROABS 10.4*  --   --   HGB 13.9 12.2 11.2*  HCT 41.0 36.0  33.0*  MCV 83.7 83.1 81.3  PLT 265 228 228   CBG:  Recent Labs Lab 01/30/14 1232 01/30/14 1334 01/30/14 1438 01/30/14 1549 01/30/14 1700  GLUCAP 181* 170* 141* 200* 204*    Recent Results (from the past 240 hour(s))  CULTURE, BLOOD (ROUTINE X 2)     Status: None   Collection Time    01/29/14  3:10 PM      Result Value Ref Range Status   Specimen Description BLOOD LEFT ANTECUBITAL   Final   Special Requests BOTTLES DRAWN AEROBIC AND  ANAEROBIC 10CC   Final   Culture  Setup Time     Final   Value: 01/29/2014 22:48     [facility]formed at [facility]   Culture     Final   Value: GRAM NEGATIVE RODS     Note: Gram Stain Report Called to,Read Back By and Verified With: CAROL SCHILLER@0943  ON [id number] BY [facility]     Performed at [facility]   Report Status PENDING   Incomplete  CULTURE, BLOOD (ROUTINE X 2)     Status: None   Collection Time    01/29/14  4:02 PM      Result Value Ref Range Status   Specimen Description BLOOD RIGHT ARM   Final   Special Requests BOTTLES DRAWN AEROBIC AND ANAEROBIC 10CC EACH   Final   Culture  Setup Time     Final   Value: 01/29/2014 22:48     Performed at [facility]   Culture     Final   Value: GRAM NEGATIVE RODS     Note: Gram Stain Report Called to,Read Back By and Verified With: CAROL SCHILLER@0943  ON [id number] BY [facility]     Performed at [facility]   Report Status PENDING   Incomplete  MRSA PCR SCREENING     Status: None   Collection Time    01/29/14 10:28 PM      Result Value Ref Range Status   MRSA by PCR NEGATIVE  NEGATIVE Final   Comment:            The GeneXpert MRSA Assay (FDA     approved for NASAL specimens     only), is one component of a     comprehensive MRSA colonization     surveillance program. It is not     intended to diagnose MRSA     infection nor to guide or     monitor treatment for     MRSA infections.     Studies:  Recent x-ray studies have been reviewed in detail by the Attending Physician  Time spent : 35 mins    Junious Silkllison Ellis, ANP [facility] Office  [phone] Pager [phone]   **If unable to reach the above provider after paging please contact the Flow Manager @ [phone]  On-Call/Text Page:      [email or web address]      password [email or web address]  If 7PM-7AM, please contact night-coverage [email or web address] Password [email or web address] 01/30/2014, 5:59 PM   LOS: 1 day   I have personally examined this patient and reviewed the entire  database. I have reviewed the above note, made any necessary editorial changes, and agree with its content.  Lonia BloodJeffrey T. Ulus Hazen, MD [facility]

## 2014-01-30 NOTE — Progress Notes (Signed)
Utilization review completed.  

## 2014-01-31 DIAGNOSIS — B9689 Other specified bacterial agents as the cause of diseases classified elsewhere: Secondary | ICD-10-CM

## 2014-01-31 DIAGNOSIS — R7881 Bacteremia: Secondary | ICD-10-CM

## 2014-01-31 DIAGNOSIS — E0590 Thyrotoxicosis, unspecified without thyrotoxic crisis or storm: Secondary | ICD-10-CM

## 2014-01-31 DIAGNOSIS — E860 Dehydration: Secondary | ICD-10-CM

## 2014-01-31 LAB — CBC
HCT: 33.8 % — ABNORMAL LOW (ref 36.0–46.0)
Hemoglobin: 11.3 g/dL — ABNORMAL LOW (ref 12.0–15.0)
MCH: 27.4 pg (ref 26.0–34.0)
MCHC: 33.4 g/dL (ref 30.0–36.0)
MCV: 81.8 fL (ref 78.0–100.0)
PLATELETS: 243 10*3/uL (ref 150–400)
RBC: 4.13 MIL/uL (ref 3.87–5.11)
RDW: 13.1 % (ref 11.5–15.5)
WBC: 8.1 10*3/uL (ref 4.0–10.5)

## 2014-01-31 LAB — T4, FREE: Free T4: 1.35 ng/dL (ref 0.80–1.80)

## 2014-01-31 LAB — BASIC METABOLIC PANEL
ANION GAP: 14 (ref 5–15)
BUN: 5 mg/dL — ABNORMAL LOW (ref 6–23)
CALCIUM: 7.9 mg/dL — AB (ref 8.4–10.5)
CO2: 20 mEq/L (ref 19–32)
CREATININE: 0.64 mg/dL (ref 0.50–1.10)
Chloride: 99 mEq/L (ref 96–112)
GFR calc Af Amer: >90 mL/min (ref >90)
GFR calc non Af Amer: >90 mL/min (ref >90)
GLUCOSE: 370 mg/dL — AB (ref 70–99)
Potassium: 3.9 mEq/L (ref 3.7–5.3)
SODIUM: 133 meq/L — AB (ref 137–147)

## 2014-01-31 LAB — HEMOGLOBIN A1C
Hgb A1c MFr Bld: 10.5 % — ABNORMAL HIGH (ref <5.7)
MEAN PLASMA GLUCOSE: 255 mg/dL — AB (ref <117)

## 2014-01-31 LAB — GLUCOSE, CAPILLARY
GLUCOSE-CAPILLARY: 219 mg/dL — AB (ref 70–99)
GLUCOSE-CAPILLARY: 267 mg/dL — AB (ref 70–99)
GLUCOSE-CAPILLARY: 298 mg/dL — AB (ref 70–99)
Glucose-Capillary: 234 mg/dL — ABNORMAL HIGH (ref 70–99)
Glucose-Capillary: 217 mg/dL — ABNORMAL HIGH (ref 70–99)
Glucose-Capillary: 153 mg/dL — ABNORMAL HIGH (ref 70–99)

## 2014-01-31 LAB — TSH: TSH: 0.114 u[IU]/mL — ABNORMAL LOW (ref 0.350–4.500)

## 2014-01-31 MED ORDER — INSULIN ASPART 100 UNIT/ML ~~LOC~~ SOLN
0.0000 [IU] | Freq: Every day | SUBCUTANEOUS | Status: DC
  Administered 2014-02-01: 2 [IU] via SUBCUTANEOUS

## 2014-01-31 MED ORDER — SODIUM CHLORIDE 0.9 % IV BOLUS (SEPSIS)
500.0000 mL | Freq: Once | INTRAVENOUS | Status: AC
  Administered 2014-01-31: 500 mL via INTRAVENOUS

## 2014-01-31 MED ORDER — IBUPROFEN 800 MG PO TABS
800.0000 mg | ORAL_TABLET | Freq: Once | ORAL | Status: AC
  Administered 2014-02-01: 800 mg via ORAL
  Filled 2014-01-31: qty 1

## 2014-01-31 MED ORDER — INSULIN ASPART 100 UNIT/ML ~~LOC~~ SOLN
5.0000 [IU] | Freq: Once | SUBCUTANEOUS | Status: AC
  Administered 2014-01-31: 5 [IU] via SUBCUTANEOUS

## 2014-01-31 MED ORDER — INSULIN ASPART 100 UNIT/ML ~~LOC~~ SOLN
0.0000 [IU] | Freq: Three times a day (TID) | SUBCUTANEOUS | Status: DC
  Administered 2014-01-31: 7 [IU] via SUBCUTANEOUS

## 2014-01-31 MED ORDER — INSULIN ASPART 100 UNIT/ML ~~LOC~~ SOLN
0.0000 [IU] | Freq: Three times a day (TID) | SUBCUTANEOUS | Status: DC
  Administered 2014-01-31 – 2014-02-01 (×3): 7 [IU] via SUBCUTANEOUS
  Administered 2014-02-01: 3 [IU] via SUBCUTANEOUS

## 2014-01-31 MED ORDER — DEXTROSE 5 % IV SOLN
2.0000 g | INTRAVENOUS | Status: DC
  Administered 2014-01-31 – 2014-02-03 (×4): 2 g via INTRAVENOUS
  Filled 2014-01-31 (×6): qty 2

## 2014-01-31 MED ORDER — METHIMAZOLE 5 MG PO TABS
15.0000 mg | ORAL_TABLET | Freq: Two times a day (BID) | ORAL | Status: DC
  Administered 2014-01-31 – 2014-02-03 (×7): 15 mg via ORAL
  Filled 2014-01-31 (×8): qty 1

## 2014-01-31 NOTE — Progress Notes (Signed)
NURSING PROGRESS NOTE  Angela Meyer [id number] Transfer Data: 01/31/2014 11:27 AM Attending Provider: Drema Dallasurtis J Woods, MD AOZ:HYQM,VHQIONGEXBPCP:AVVA,RAVISANKAR R, MD Code Status: Full   Angela Meyer is a 18 y.o. female patient transferred from 3S  -No acute distress noted.  -No complaints of shortness of breath.  -No complaints of chest pain.   Last Documented Vital Signs: Blood pressure 108/75, pulse 86, temperature 98.1 F (36.7 C), temperature source Oral, resp. rate 20, height 5\' 2"  (1.575 m), weight 53.524 kg (118 lb), SpO2 99.00%.  IV Fluids:  IV in place, occlusive dsg intact without redness, IV cath antecubital left, condition patent and no redness normal saline.   Allergies:  Review of patient's allergies indicates no known allergies.  Past Medical History:   has a past medical history of Goiter with hyperthyroidism; Weight loss, abnormal; Fetal tachycardia before the onset of labor; Tremor; Thyroiditis, autoimmune; Dyspepsia; Irritability; Thyrotoxicosis with diffuse goiter; Chronic autoimmune thyroiditis; Tachycardia; Tremor; Weight loss; Irritability; and Diabetes mellitus.  Past Surgical History:   has no past surgical history on file.  Social History:   reports that she has never smoked. She has never used smokeless tobacco. She reports that she does not drink alcohol or use illicit drugs.  Skin: intact  Patient/Family orientated to room. Information packet given to patient/family. Admission inpatient armband information verified with patient/family to include name and date of birth and placed on patient arm. Side rails up x 2, fall assessment and education completed with patient/family. Patient/family able to verbalize understanding of risk associated with falls and verbalized understanding to call for assistance before getting out of bed. Call light within reach. Patient/family able to voice and demonstrate understanding of unit orientation instructions.

## 2014-01-31 NOTE — Progress Notes (Signed)
[facility]  Angela Meyer ZOX:[id number] DOB: 05/06/1996 DOA: 01/29/2014 PCP: Hoyle Sauer, MD  Brief narrative: 18 y.o. BF PMHx DM Type 1 (diagnosed junior high school), hyperthyroidism, who presented to the ED with N/V and abdominal pain. Symptoms onset 3 days prior to presentation. She has had multiple episodes of vomiting. She reported that her CBGs had become elevated over the previous several days despite no changes to medications and she did not understand why as they had been well controlled for the past 6 months. Symptoms were also associated with abdominal pain and fever.   In the ER her serum glucose was 298 with and AG of 24 and normal serum lactate. Her UA was consistent with a urinary tract infection.  HPI/Subjective: 8/11 patient states Dr. Vassie Loll (PCP) manageable for her hyperthyroidism and diabetes. States CVA tenderness has resolved, negative suprapubic tenderness, negative CP/SOB. Negative N./V.   Assessment/Plan:   DKA - Uncontrolled type 1 DM -etiology is underlying significant infection  -stable on Lantus but CBGs elevated -as high as 370 this am so will increase SSI to resistant -may require the addition of meal coverage -8/11 Hemoglobin A1c = 10.5 ; patient's diabetes has been uncontrolled on her previous home regimen will need to titrate medications prior to discharge   Acute Pyelonephritis due to E Coli -continue empiric anbxs -follow up on cx sensitivities -TM overnight 103 -Will await sensitivities prior to converting to PO antibiotics. In addition patient will need to be afebrile for 48 hours prior to discharge.  E coli bacteremia  -hemodynamically stable -see above -platelets normal  Hyperthyroidism / Thyroiditis, autoimmune -followed OP by Dr. Fransico Michael previously -last TSH in 2012 was >10  -pt endorses Dr. Felipa Eth now manages and TSH had been OK -resume home Methimazole -TSH suboptimal -ck T4- likely due to  acute stress of illness  Dehydration -cont IVFs -urine had cleared but dark again    DVT prophylaxis: SQ Heparin Code Status: Full Family Communication: Mother at bedside Disposition Plan/Expected LOS: Transfer to floor  Consultants: None  Procedures: None  Cultures: 8/9Urine cx  positive >>> E Coli 8/9 Blood cx x 2 positive >>> E Coli  Antibiotics: Rocephin 8/9 >>>  Objective: Blood pressure 108/75, pulse 86, temperature 98.1 F (36.7 C), temperature source Oral, resp. rate 20, height 5\' 2"  (1.575 m), weight 118 lb (53.524 kg), SpO2 99.00%.  Intake/Output Summary (Last 24 hours) at 01/31/14 1243 Last data filed at 01/31/14 1000  Gross per 24 hour  Intake 4058.99 ml  Output   1875 ml  Net 2183.99 ml   Exam: Gen: A/O x 4, NAD,No acute respiratory distress Chest: Clear to auscultation bilaterally, room air Cardiac: Tachycardia, Regular rhythm, S1-S2, no rubs murmurs or gallops, no peripheral edema-IVF at 100/hr Abdomen: Soft. nontender over suprapubic area- nondistended without obvious hepatosplenomegaly, no ascites, Negative CVA tenderness Extremities: Symmetrical in appearance without cyanosis, clubbing or effusion  Scheduled Meds:  Scheduled Meds: . cefTRIAXone (ROCEPHIN)  IV  2 g Intravenous Q24H  . heparin  5,000 Units Subcutaneous 3 times per day  . insulin aspart  0-20 Units Subcutaneous TID WC  . insulin aspart  0-5 Units Subcutaneous QHS  . insulin glargine  14 Units Subcutaneous Q24H  . methimazole  15 mg Oral BID   Data Reviewed: Basic Metabolic Panel:  Recent Labs Lab 01/30/14 0700 01/30/14 1341 01/30/14 1610 01/30/14 2010 01/31/14 0228  NA 135* 133* 132* 132* 133*  K 3.8 3.3*  3.2* 3.0* 3.9  CL 103 102 100 98 99  CO2 18* 19 19 18* 20  GLUCOSE 152* 168* 250* 189* 370*  BUN 3* 3* 3* 3* 5*  CREATININE 0.54 0.58 0.62 0.59 0.64  CALCIUM 8.4 8.4 8.3* 8.0* 7.9*   Liver Function Tests:  Recent Labs Lab 01/29/14 1510  AST 14  ALT 13   ALKPHOS 80  BILITOT 0.7  PROT 8.2  ALBUMIN 4.0   CBC:  Recent Labs Lab 01/29/14 1510 01/29/14 2056 01/29/14 2255 01/31/14 0228  WBC 13.0* 9.4 10.1 8.1  NEUTROABS 10.4*  --   --   --   HGB 13.9 12.2 11.2* 11.3*  HCT 41.0 36.0 33.0* 33.8*  MCV 83.7 83.1 81.3 81.8  PLT 265 228 228 243   CBG:  Recent Labs Lab 01/30/14 2132 01/31/14 0045 01/31/14 0509 01/31/14 0717 01/31/14 1148  GLUCAP 191* 267* 298* 234* 219*    Recent Results (from the past 240 hour(s))  URINE CULTURE     Status: None   Collection Time    01/29/14  2:53 PM      Result Value Ref Range Status   Specimen Description URINE, CLEAN CATCH   Final   Special Requests NONE   Final   Culture  Setup Time     Final   Value: 01/29/2014 23:06     Performed at [facility]   Colony Count     Final   Value: >=100,000 COLONIES/ML     Performed at [facility]   Culture     Final   Value: ESCHERICHIA COLI     Performed at [facility]   Report Status PENDING   Incomplete  CULTURE, BLOOD (ROUTINE X 2)     Status: None   Collection Time    01/29/14  3:10 PM      Result Value Ref Range Status   Specimen Description BLOOD LEFT ANTECUBITAL   Final   Special Requests BOTTLES DRAWN AEROBIC AND ANAEROBIC 10CC   Final   Culture  Setup Time     Final   Value: 01/29/2014 22:48     Performed at [facility]   Culture     Final   Value: ESCHERICHIA COLI     Note: Gram Stain Report Called to,Read Back By and Verified With: CAROL SCHILLER@0943  ON [id number] BY [facility]     Performed at [facility]   Report Status PENDING   Incomplete  CULTURE, BLOOD (ROUTINE X 2)     Status: None   Collection Time    01/29/14  4:02 PM      Result Value Ref Range Status   Specimen Description BLOOD RIGHT ARM   Final   Special Requests BOTTLES DRAWN AEROBIC AND ANAEROBIC 10CC EACH   Final   Culture  Setup Time     Final   Value: 01/29/2014 22:48     Performed at [facility]   Culture      Final   Value: ESCHERICHIA COLI     Note: Gram Stain Report Called to,Read Back By and Verified With: CAROL SCHILLER@0943  ON [id number] BY [facility]     Performed at [facility]   Report Status PENDING   Incomplete  MRSA PCR SCREENING     Status: None   Collection Time    01/29/14 10:28 PM      Result Value Ref Range Status   MRSA by PCR NEGATIVE  NEGATIVE Final   Comment:  The GeneXpert MRSA Assay (FDA     approved for NASAL specimens     only), is one component of a     comprehensive MRSA colonization     surveillance program. It is not     intended to diagnose MRSA     infection nor to guide or     monitor treatment for     MRSA infections.     Studies:  Recent x-ray studies have been reviewed in detail by the Attending Physician  Time spent : 35 mins    Junious Silk, ANP [facility] Office  [phone] Pager [phone]   **If unable to reach the above provider after paging please contact the Flow Manager @ [phone]  On-Call/Text Page:      [email or web address]      password [email or web address]  If 7PM-7AM, please contact night-coverage [email or web address] Password [email or web address] 01/31/2014, 12:43 PM   LOS: 2 days   Examined patient and discussed assessment and plan with ANP Revonda Standard and agree with above plan.  Answered All patient's questions. Patient with multiple complex medical problems> 35 minutes spent with direct patient care.

## 2014-01-31 NOTE — Progress Notes (Signed)
Paged MD to make aware of patients temp of 100.7. Will give tylenol and recheck. Also let MD know of red tint to urine that patient states as new today. Will continue to monitor.

## 2014-01-31 NOTE — Progress Notes (Addendum)
Inpatient Diabetes Program Recommendations  AACE/ADA: New Consensus Statement on Inpatient Glycemic Control (2013)  Target Ranges:  Prepandial:   less than 140 mg/dL      Peak postprandial:   less than 180 mg/dL (1-2 hours)      Critically ill patients:  140 - 180 mg/dL   Reason for Assessment:  Results for Mccreery, [place] A (MRN [id number]) as of 01/31/2014 14:00  Ref. Range 01/31/2014 00:45 01/31/2014 02:28 01/31/2014 05:09 01/31/2014 07:17 01/31/2014 11:48  Glucose-Capillary Latest Range: 70-99 mg/dL 409267 (H)  811298 (H) 914234 (H) 219 (H)   Diabetes history: Type 1 diabetes-Note that A1C=10.5% indicating poor control of diabetes in the past 3 months. Outpatient Diabetes medications: Lantus 14 units daily, Novolog with meals and for correction Current orders for Inpatient glycemic control:  Lantus 14 units daily, Novolog resistant tid with meals.  Due to patients history of Type 1 diabetes and sensitivity to insulin, consider reducing Novolog correction to sensitive tid with meals and HS coverage.  Also please add Novolog 4 units tid with meals-to be held if patient eats less than 50%.  May also need to increase Lantus to 18 units q HS due to infection/increased insulin needs.   Needs follow-up with Dr. Felipa EthAvva regarding elevated A1C.  Thanks, Beryl MeagerJenny Arnetha Silverthorne, RN, BC-ADM Inpatient Diabetes Coordinator Pager [phone]

## 2014-01-31 NOTE — Progress Notes (Signed)
Pt to TX to 5W-34, VSS, called report. Called family to make aware of TX.

## 2014-02-01 LAB — URINE CULTURE: Colony Count: >=100000

## 2014-02-01 LAB — GLUCOSE, CAPILLARY
GLUCOSE-CAPILLARY: 127 mg/dL — AB (ref 70–99)
GLUCOSE-CAPILLARY: 205 mg/dL — AB (ref 70–99)
Glucose-Capillary: 160 mg/dL — ABNORMAL HIGH (ref 70–99)
Glucose-Capillary: 214 mg/dL — ABNORMAL HIGH (ref 70–99)

## 2014-02-01 LAB — CULTURE, BLOOD (ROUTINE X 2)

## 2014-02-01 MED ORDER — INSULIN ASPART 100 UNIT/ML ~~LOC~~ SOLN
4.0000 [IU] | Freq: Three times a day (TID) | SUBCUTANEOUS | Status: DC
  Administered 2014-02-01 – 2014-02-03 (×6): 4 [IU] via SUBCUTANEOUS

## 2014-02-01 MED ORDER — SODIUM CHLORIDE 0.9 % IV BOLUS (SEPSIS)
500.0000 mL | Freq: Once | INTRAVENOUS | Status: AC
  Administered 2014-02-01: 500 mL via INTRAVENOUS

## 2014-02-01 MED ORDER — INSULIN ASPART 100 UNIT/ML ~~LOC~~ SOLN
0.0000 [IU] | Freq: Three times a day (TID) | SUBCUTANEOUS | Status: DC
  Administered 2014-02-01: 2 [IU] via SUBCUTANEOUS
  Administered 2014-02-02: 5 [IU] via SUBCUTANEOUS
  Administered 2014-02-02: 2 [IU] via SUBCUTANEOUS
  Administered 2014-02-02: 5 [IU] via SUBCUTANEOUS
  Administered 2014-02-03: 7 [IU] via SUBCUTANEOUS
  Administered 2014-02-03: 2 [IU] via SUBCUTANEOUS

## 2014-02-01 MED ORDER — INSULIN GLARGINE 100 UNIT/ML ~~LOC~~ SOLN
18.0000 [IU] | SUBCUTANEOUS | Status: DC
  Administered 2014-02-02 – 2014-02-03 (×2): 18 [IU] via SUBCUTANEOUS
  Filled 2014-02-01 (×2): qty 0.18

## 2014-02-01 MED ORDER — INSULIN ASPART 100 UNIT/ML ~~LOC~~ SOLN: 0.0000 [IU] | Freq: Three times a day (TID) | SUBCUTANEOUS | Status: DC

## 2014-02-01 MED ORDER — SENNOSIDES-DOCUSATE SODIUM 8.6-50 MG PO TABS
2.0000 | ORAL_TABLET | Freq: Once | ORAL | Status: AC
  Administered 2014-02-01: 2 via ORAL
  Filled 2014-02-01: qty 2

## 2014-02-01 NOTE — Progress Notes (Signed)
[facility]  Angela Meyer ZOX:[id number] DOB: 1995-09-12 DOA: 01/29/2014 PCP: Hoyle Sauer, MD  Brief narrative: 18 y.o. BF PMHx DM Type 1 (diagnosed junior high school), hyperthyroidism, who presented to the ED with N/V and abdominal pain. Symptoms onset 3 days prior to presentation. She has had multiple episodes of vomiting. She reported that her CBGs had become elevated over the previous several days despite no changes to medications and she did not understand why as they had been well controlled for the past 6 months. Symptoms were also associated with abdominal pain and fever.   In the ER her serum glucose was 298 with and AG of 24 and normal serum lactate. Her UA was consistent with a urinary tract infection.  HPI/Subjective: Patient reports feeling much better.  Able to eat.  Reports no bowel movement for several days.  Assessment/Plan:   DKA - Uncontrolled type 1 DM -etiology is underlying significant infection  -adjusted insulin per DM coordinator's recs. -8/11 Hemoglobin A1c = 10.5  Acute Pyelonephritis due to E Coli -continue IV Rocephin- still continues to have fever- watch another 24 hrs if persistently febrile will either need a CT of the abdomen or a ultrasound -E coli is sensitive to Rocephin and Cipro.  E coli bacteremia  -hemodynamically stable -Tmax is 102.7 -Recommend 24 hours without a fever prior to d/c.  Hyperthyroidism / Thyroiditis, autoimmune -followed OP by Dr. Fransico Michael previously -last TSH in 2012 was >10  -pt endorses Dr. Felipa Eth now manages and TSH had been OK -resume home Methimazole -TSH suboptimal, T4 wnl.  Dehydration -Resolved with IVF.  DVT prophylaxis: SQ Heparin Code Status: Full Family Communication: patient alert and orientated. Disposition Plan/Expected LOS: d/c to home when appropriate.  Consultants: None  Procedures: None  Cultures: 8/9Urine cx  positive >>> E Coli 8/9 Blood cx x 2  positive >>> E Coli  Antibiotics: Rocephin 8/9 >>>  Objective: Blood pressure 115/83, pulse 71, temperature 98.4 F (36.9 C), temperature source Oral, resp. rate 20, height 5\' 2"  (1.575 m), weight 53.524 kg (118 lb), SpO2 98.00%.  Intake/Output Summary (Last 24 hours) at 02/01/14 1401 Last data filed at 01/31/14 1749  Gross per 24 hour  Intake 831.67 ml  Output      0 ml  Net 831.67 ml   Exam: Gen: A/O x 4, NAD, sitting up in bed.  Preparing to eat breakfast. Chest: Clear to auscultation bilaterally, room air Cardiac: Tachycardia, Regular rhythm, S1-S2, no rubs murmurs or gallops, no peripheral edema Abdomen: Soft, nontender, nondistended, no ascites, Negative CVA tenderness, + Bowel sounds. Extremities: Symmetrical in appearance without cyanosis, clubbing or effusion  Scheduled Meds:  Scheduled Meds: . cefTRIAXone (ROCEPHIN)  IV  2 g Intravenous Q24H  . heparin  5,000 Units Subcutaneous 3 times per day  . insulin aspart  0-5 Units Subcutaneous QHS  . insulin aspart  0-9 Units Subcutaneous TID WC  . insulin aspart  4 Units Subcutaneous TID WC  . [START ON 02/02/2014] insulin glargine  18 Units Subcutaneous Q24H  . methimazole  15 mg Oral BID   Data Reviewed: Basic Metabolic Panel:  Recent Labs Lab 01/30/14 0700 01/30/14 1341 01/30/14 1610 01/30/14 2010 01/31/14 0228  NA 135* 133* 132* 132* 133*  K 3.8 3.3* 3.2* 3.0* 3.9  CL 103 102 100 98 99  CO2 18* 19 19 18* 20  GLUCOSE 152* 168* 250* 189* 370*  BUN 3* 3* 3* 3* 5*  CREATININE  0.54 0.58 0.62 0.59 0.64  CALCIUM 8.4 8.4 8.3* 8.0* 7.9*   Liver Function Tests:  Recent Labs Lab 01/29/14 1510  AST 14  ALT 13  ALKPHOS 80  BILITOT 0.7  PROT 8.2  ALBUMIN 4.0   CBC:  Recent Labs Lab 01/29/14 1510 01/29/14 2056 01/29/14 2255 01/31/14 0228  WBC 13.0* 9.4 10.1 8.1  NEUTROABS 10.4*  --   --   --   HGB 13.9 12.2 11.2* 11.3*  HCT 41.0 36.0 33.0* 33.8*  MCV 83.7 83.1 81.3 81.8  PLT 265 228 228 243    CBG:  Recent Labs Lab 01/31/14 1148 01/31/14 1716 01/31/14 2133 02/01/14 0750 02/01/14 1158  GLUCAP 219* 217* 153* 127* 205*    Recent Results (from the past 240 hour(s))  URINE CULTURE     Status: None   Collection Time    01/29/14  2:53 PM      Result Value Ref Range Status   Specimen Description URINE, CLEAN CATCH   Final   Special Requests NONE   Final   Culture  Setup Time     Final   Value: 01/29/2014 23:06     Performed at [facility]   Colony Count     Final   Value: >=100,000 COLONIES/ML     Performed at [facility]   Culture     Final   Value: ESCHERICHIA COLI     Performed at [facility]   Report Status 02/01/2014 FINAL   Final   Organism ID, Bacteria ESCHERICHIA COLI   Final  CULTURE, BLOOD (ROUTINE X 2)     Status: None   Collection Time    01/29/14  3:10 PM      Result Value Ref Range Status   Specimen Description BLOOD LEFT ANTECUBITAL   Final   Special Requests BOTTLES DRAWN AEROBIC AND ANAEROBIC 10CC   Final   Culture  Setup Time     Final   Value: 01/29/2014 22:48     Performed at [facility]   Culture     Final   Value: ESCHERICHIA COLI     Note: Gram Stain Report Called to,Read Back By and Verified With: CAROL SCHILLER@0943  ON [id number] BY [facility]     Performed at [facility]   Report Status 02/01/2014 FINAL   Final   Organism ID, Bacteria ESCHERICHIA COLI   Final  CULTURE, BLOOD (ROUTINE X 2)     Status: None   Collection Time    01/29/14  4:02 PM      Result Value Ref Range Status   Specimen Description BLOOD RIGHT ARM   Final   Special Requests BOTTLES DRAWN AEROBIC AND ANAEROBIC 10CC EACH   Final   Culture  Setup Time     Final   Value: 01/29/2014 22:48     Performed at [facility]   Culture     Final   Value: ESCHERICHIA COLI     Note: SUSCEPTIBILITIES PERFORMED ON PREVIOUS CULTURE WITHIN THE LAST 5 DAYS.     Note: Gram Stain Report Called to,Read Back By and Verified With: CAROL  SCHILLER@0943  ON [id number] BY [facility]     Performed at [facility]   Report Status 02/01/2014 FINAL   Final  MRSA PCR SCREENING     Status: None   Collection Time    01/29/14 10:28 PM      Result Value Ref Range Status   MRSA by PCR NEGATIVE  NEGATIVE Final   Comment:            The GeneXpert MRSA Assay (FDA     approved for NASAL specimens     only), is one component of a     comprehensive MRSA colonization     surveillance program. It is not     intended to diagnose MRSA     infection nor to guide or     monitor treatment for     MRSA infections.      Time spent : 35 mins   Algis Downs, [place] [facility] Pager: [phone]  If 7PM-7AM, please contact night-coverage [email or web address] Password [email or web address] 02/01/2014, 2:01 PM   LOS: 3 days   Attending Patient was seen, examined,treatment plan was discussed with the Physician extender. I have directly reviewed the clinical findings, lab, imaging studies and management of this patient in detail. I have made the necessary changes to the above noted documentation, and agree with the documentation, as recorded by the Physician extender.  Windell Norfolk MD [facility].

## 2014-02-01 NOTE — Progress Notes (Signed)
Inpatient Diabetes Program Recommendations  AACE/ADA: New Consensus Statement on Inpatient Glycemic Control (2013)  Target Ranges:  Prepandial:   less than 140 mg/dL      Peak postprandial:   less than 180 mg/dL (1-2 hours)      Critically ill patients:  140 - 180 mg/dL   Reason for Visit: Results for Angela Meyer, [place] A (MRN [id number]) as of 02/01/2014 15:43  Ref. Range 01/31/2014 02:28  Hemoglobin A1C Latest Range: <5.7 % 10.5 (H)   Discussed results of A1C with patient.  Encouraged her to follow-up with Dr. Felipa EthAvva.  She states that she does cover CHO at home 1 unit for every 15 grams of CHO.  Note that Lantus increased and Novolog meal coverage added today.  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  Discussed goal A1C and blood glucoses.  Also discussed the effects of infection on CBG's.  Will follow.  Beryl MeagerJenny Kaine Mcquillen, RN, BC-ADM Inpatient Diabetes Coordinator Pager [phone]

## 2014-02-02 ENCOUNTER — Inpatient Hospital Stay (HOSPITAL_COMMUNITY): Payer: BC Managed Care – PPO

## 2014-02-02 LAB — CBC WITH DIFFERENTIAL/PLATELET
BASOS ABS: 0.1 10*3/uL (ref 0.0–0.1)
Basophils Relative: 2 % — ABNORMAL HIGH (ref 0–1)
EOS ABS: 0 10*3/uL (ref 0.0–0.7)
Eosinophils Relative: 0 % (ref 0–5)
HEMATOCRIT: 33.3 % — AB (ref 36.0–46.0)
Hemoglobin: 11.3 g/dL — ABNORMAL LOW (ref 12.0–15.0)
LYMPHS PCT: 23 % (ref 12–46)
Lymphs Abs: 1.7 10*3/uL (ref 0.7–4.0)
MCH: 27.6 pg (ref 26.0–34.0)
MCHC: 33.9 g/dL (ref 30.0–36.0)
MCV: 81.4 fL (ref 78.0–100.0)
MONOS PCT: 27 % — AB (ref 3–12)
Monocytes Absolute: 2 10*3/uL — ABNORMAL HIGH (ref 0.1–1.0)
NEUTROS ABS: 3.5 10*3/uL (ref 1.7–7.7)
NEUTROS PCT: 48 % (ref 43–77)
Platelets: 328 10*3/uL (ref 150–400)
RBC: 4.09 MIL/uL (ref 3.87–5.11)
RDW: 13.4 % (ref 11.5–15.5)
WBC Morphology: INCREASED
WBC: 7.3 10*3/uL (ref 4.0–10.5)

## 2014-02-02 LAB — BASIC METABOLIC PANEL
Anion gap: 16 — ABNORMAL HIGH (ref 5–15)
BUN: 3 mg/dL — ABNORMAL LOW (ref 6–23)
CALCIUM: 8.5 mg/dL (ref 8.4–10.5)
CHLORIDE: 98 meq/L (ref 96–112)
CO2: 24 meq/L (ref 19–32)
CREATININE: 0.56 mg/dL (ref 0.50–1.10)
GFR calc Af Amer: >90 mL/min (ref >90)
GFR calc non Af Amer: >90 mL/min (ref >90)
GLUCOSE: 175 mg/dL — AB (ref 70–99)
Potassium: 3 mEq/L — ABNORMAL LOW (ref 3.7–5.3)
Sodium: 138 mEq/L (ref 137–147)

## 2014-02-02 LAB — GLUCOSE, CAPILLARY
GLUCOSE-CAPILLARY: 171 mg/dL — AB (ref 70–99)
GLUCOSE-CAPILLARY: 255 mg/dL — AB (ref 70–99)
GLUCOSE-CAPILLARY: 253 mg/dL — AB (ref 70–99)
Glucose-Capillary: 191 mg/dL — ABNORMAL HIGH (ref 70–99)

## 2014-02-02 NOTE — Progress Notes (Addendum)
PATIENT DETAILS Name: Angela Meyer Age: 18 y.o. Sex: female Date of Birth: [birth year] Admit Date: 01/29/2014 Admitting Physician Hillary Bow, DO ZOX:[id number] R, MD  Subjective: No major complaints. Denies any back pain or abdominal pain.  Assessment/Plan: Principal Problem:   DKA, type 1 - Patient was admitted, given IV fluids and IV insulin. Once her anion gap closed, she was transitioned to Lantus and SSI. -Please see below for further details  Active Problems: Escherichia coli pyelonephritis with bacteremia - Blood and urine cultures positive for Escherichia coli, started on Rocephin-day 5. - Unfortunately, continues to be persistently febrile, however her fever curve seems to be decreasing. Continue with Rocephin, if continues to be febrile will consult infectious disease. CT scan of the abdomen does not show any abscess or renal stones.  Diabetes-type 1- uncontrolled - CBGs stable, continue with Lantus and NovoLog. - A1c of 10.5  Hyperthyroidism - Apparently has a history of autoimmune thyroiditis, now back on Tapazole. Free T4 within normal limits.  Disposition: Remain inpatient  DVT Prophylaxis: Prophylactic Heparin  Code Status: Full code   Family Communication Mother and father at bedside  Procedures:  None  CONSULTS:  None   MEDICATIONS: Scheduled Meds: . cefTRIAXone (ROCEPHIN)  IV  2 g Intravenous Q24H  . heparin  5,000 Units Subcutaneous 3 times per day  . insulin aspart  0-5 Units Subcutaneous QHS  . insulin aspart  0-9 Units Subcutaneous TID WC  . insulin aspart  4 Units Subcutaneous TID WC  . insulin glargine  18 Units Subcutaneous Q24H  . methimazole  15 mg Oral BID   Continuous Infusions:  PRN Meds:.acetaminophen, dextrose, ketorolac, ondansetron  Antibiotics: Anti-infectives   Start     Dose/Rate Route Frequency Ordered Stop   01/31/14 1600  cefTRIAXone (ROCEPHIN) 2 g in dextrose 5 % 50 mL IVPB     2 g 100 mL/hr  over 30 Minutes Intravenous Every 24 hours 01/31/14 1018     01/30/14 1715  cefTRIAXone (ROCEPHIN) 1 g in dextrose 5 % 50 mL IVPB  Status:  Discontinued     1 g 100 mL/hr over 30 Minutes Intravenous Every 24 hours 01/29/14 1912 01/31/14 1018   01/29/14 1745  cefTRIAXone (ROCEPHIN) 1 g in dextrose 5 % 50 mL IVPB     1 g 100 mL/hr over 30 Minutes Intravenous  Once 01/29/14 1731 01/29/14 1828       PHYSICAL EXAM: Vital signs in last 24 hours: Filed Vitals:   02/01/14 2018 02/01/14 2203 02/02/14 0010 02/02/14 0400  BP: 108/73  118/81 111/78  Pulse: 94  81 95  Temp: 102.1 F (38.9 C) 98.6 F (37 C) 99 F (37.2 C) 98.9 F (37.2 C)  TempSrc: Oral Oral Oral Oral  Resp: 20  20 20   Height:      Weight:      SpO2: 99%  100% 98%    Weight change:  Filed Weights   01/29/14 1432 01/31/14 1122  Weight: 54.432 kg (120 lb) 53.524 kg (118 lb)   Body mass index is 21.58 kg/(m^2).   Gen Exam: Awake and alert with clear speech.   Neck: Supple, No JVD.   Chest: B/L Clear.   CVS: S1 S2 Regular, no murmurs.  Abdomen: soft, BS +, non tender, non distended. No CVA tenderness Extremities: no edema, lower extremities warm to touch. Neurologic: Non Focal.   Skin: No Rash.   Wounds: N/A.    Intake/Output from  previous day:  Intake/Output Summary (Last 24 hours) at 02/02/14 1003 Last data filed at 02/01/14 1749  Gross per 24 hour  Intake   2450 ml  Output      0 ml  Net   2450 ml     LAB RESULTS: CBC  Recent Labs Lab 01/29/14 1510 01/29/14 2056 01/29/14 2255 01/31/14 0228 02/02/14 0633  WBC 13.0* 9.4 10.1 8.1 7.3  HGB 13.9 12.2 11.2* 11.3* 11.3*  HCT 41.0 36.0 33.0* 33.8* 33.3*  PLT 265 228 228 243 328  MCV 83.7 83.1 81.3 81.8 81.4  MCH 28.4 28.2 27.6 27.4 27.6  MCHC 33.9 33.9 33.9 33.4 33.9  RDW 12.8 12.8 12.6 13.1 13.4  LYMPHSABS 0.6*  --   --   --  1.7  MONOABS 2.0*  --   --   --  2.0*  EOSABS 0.0  --   --   --  0.0  BASOSABS 0.0  --   --   --  0.1    Chemistries     Recent Labs Lab 01/30/14 1341 01/30/14 1610 01/30/14 2010 01/31/14 0228 02/02/14 0633  NA 133* 132* 132* 133* 138  K 3.3* 3.2* 3.0* 3.9 3.0*  CL 102 100 98 99 98  CO2 19 19 18* 20 24  GLUCOSE 168* 250* 189* 370* 175*  BUN 3* 3* 3* 5* 3*  CREATININE 0.58 0.62 0.59 0.64 0.56  CALCIUM 8.4 8.3* 8.0* 7.9* 8.5    CBG:  Recent Labs Lab 02/01/14 0750 02/01/14 1158 02/01/14 1730 02/01/14 2148 02/02/14 0750  GLUCAP 127* 205* 160* 214* 171*    GFR Estimated Creatinine Clearance: 88.7 ml/min (by C-G formula based on Cr of 0.56).  Coagulation profile No results found for this basename: INR, PROTIME,  in the last 168 hours  Cardiac Enzymes No results found for this basename: CK, CKMB, TROPONINI, MYOGLOBIN,  in the last 168 hours  No components found with this basename: POCBNP,  No results found for this basename: DDIMER,  in the last 72 hours  Recent Labs  01/31/14 0228  HGBA1C 10.5*   No results found for this basename: CHOL, HDL, LDLCALC, TRIG, CHOLHDL, LDLDIRECT,  in the last 72 hours  Recent Labs  01/31/14 0228  TSH 0.114*   No results found for this basename: VITAMINB12, FOLATE, FERRITIN, TIBC, IRON, RETICCTPCT,  in the last 72 hours No results found for this basename: LIPASE, AMYLASE,  in the last 72 hours  Urine Studies No results found for this basename: UACOL, UAPR, USPG, UPH, UTP, UGL, UKET, UBIL, UHGB, UNIT, UROB, ULEU, UEPI, UWBC, URBC, UBAC, CAST, CRYS, UCOM, BILUA,  in the last 72 hours  MICROBIOLOGY: Recent Results (from the past 240 hour(s))  URINE CULTURE     Status: None   Collection Time    01/29/14  2:53 PM      Result Value Ref Range Status   Specimen Description URINE, CLEAN CATCH   Final   Special Requests NONE   Final   Culture  Setup Time     Final   Value: 01/29/2014 23:06     [facility]formed [facility]   Colony Count     Final   Value: >=100,000 COLONIES/ML     [facility]formed [facility]   Culture     Final    Value: ESCHERICHIA COLI     [facility]formed at [facility]   Report Status 02/01/2014 FINAL   Final   Organism ID, Bacteria ESCHERICHIA COLI  Final  CULTURE, BLOOD (ROUTINE X 2)     Status: None   Collection Time    01/29/14  3:10 PM      Result Value Ref Range Status   Specimen Description BLOOD LEFT ANTECUBITAL   Final   Special Requests BOTTLES DRAWN AEROBIC AND ANAEROBIC 10CC   Final   Culture  Setup Time     Final   Value: 01/29/2014 22:48     Performed at [facility]   Culture     Final   Value: ESCHERICHIA COLI     Note: Gram Stain Report Called to,Read Back By and Verified With: CAROL SCHILLER@0943  ON [id number] BY [facility]     Performed at [facility]   Report Status 02/01/2014 FINAL   Final   Organism ID, Bacteria ESCHERICHIA COLI   Final  CULTURE, BLOOD (ROUTINE X 2)     Status: None   Collection Time    01/29/14  4:02 PM      Result Value Ref Range Status   Specimen Description BLOOD RIGHT ARM   Final   Special Requests BOTTLES DRAWN AEROBIC AND ANAEROBIC 10CC EACH   Final   Culture  Setup Time     Final   Value: 01/29/2014 22:48     Performed at [facility]   Culture     Final   Value: ESCHERICHIA COLI     Note: SUSCEPTIBILITIES PERFORMED ON PREVIOUS CULTURE WITHIN THE LAST 5 DAYS.     Note: Gram Stain Report Called to,Read Back By and Verified With: CAROL SCHILLER@0943  ON [id number] BY [facility]     Performed at [facility]   Report Status 02/01/2014 FINAL   Final  MRSA PCR SCREENING     Status: None   Collection Time    01/29/14 10:28 PM      Result Value Ref Range Status   MRSA by PCR NEGATIVE  NEGATIVE Final   Comment:            The GeneXpert MRSA Assay (FDA     approved for NASAL specimens     only), is one component of a     comprehensive MRSA colonization     surveillance program. It is not     intended to diagnose MRSA     infection nor to guide or     monitor treatment for     MRSA infections.  CULTURE, BLOOD (ROUTINE X  2)     Status: None   Collection Time    02/01/14  4:20 PM      Result Value Ref Range Status   Specimen Description BLOOD RIGHT ARM   Final   Special Requests BOTTLES DRAWN AEROBIC ONLY 6CC   Final   Culture  Setup Time     Final   Value: 02/01/2014 21:28     Performed at [facility]   Culture     Final   Value:        BLOOD CULTURE RECEIVED NO GROWTH TO DATE CULTURE WILL BE HELD FOR 5 DAYS BEFORE ISSUING A FINAL NEGATIVE REPORT     Performed at [facility]   Report Status PENDING   Incomplete  CULTURE, BLOOD (ROUTINE X 2)     Status: None   Collection Time    02/01/14  4:35 PM      Result Value Ref Range Status   Specimen Description BLOOD LEFT HAND   Final   Special Requests BOTTLES DRAWN  AEROBIC ONLY 5CC   Final   Culture  Setup Time     Final   Value: 02/01/2014 21:28     Performed at [facility]   Culture     Final   Value:        BLOOD CULTURE RECEIVED NO GROWTH TO DATE CULTURE WILL BE HELD FOR 5 DAYS BEFORE ISSUING A FINAL NEGATIVE REPORT     Performed at [facility]   Report Status PENDING   Incomplete    RADIOLOGY STUDIES/RESULTS: Ct Abdomen Pelvis Wo Contrast  02/02/2014   CLINICAL DATA:  Fevers  EXAM: CT ABDOMEN AND PELVIS WITHOUT CONTRAST  TECHNIQUE: Multidetector CT imaging of the abdomen and pelvis was performed following the standard protocol without IV contrast.  COMPARISON:  None.  FINDINGS: Lung bases show minimal left basilar atelectasis.  The liver, gallbladder, spleen, adrenal glands and pancreas are all normal in their CT appearance. Kidneys are well visualized bilaterally. No calculi or obstructive changes are noted. The bladder is well distended. No pelvic mass lesion is seen. The appendix is within normal limits. Scattered diverticular change is noted without diverticulitis. The bony structures are within normal limits.  IMPRESSION: Chronic changes without acute abdominal abnormality.  Minimal left basilar atelectasis.    Electronically Signed   By: Alcide Clever M.D.   On: 02/02/2014 08:25   Dg Chest 2 View  01/29/2014   CLINICAL DATA:  Abdominal pain. Nausea and vomiting. Fever. Possible arrhythmia. Diabetic.  EXAM: CHEST  2 VIEW  COMPARISON:  03/29/2007  FINDINGS: The heart size and mediastinal contours are within normal limits. Both lungs are clear. The visualized skeletal structures are unremarkable.  IMPRESSION: No active cardiopulmonary disease.  Improved appearance from priors.   Electronically Signed   By: Davonna Belling M.D.   On: 01/29/2014 15:39    Jeoffrey Massed, MD  [facility] Pager:[phone]  If 7PM-7AM, please contact night-coverage [email or web address] Password [email or web address] 02/02/2014, 10:03 AM   LOS: 4 days   **Disclaimer: This note may have been dictated with voice recognition software. Similar sounding words can inadvertently be transcribed and this note may contain transcription errors which may not have been corrected upon publication of note.**

## 2014-02-03 DIAGNOSIS — N12 Tubulo-interstitial nephritis, not specified as acute or chronic: Secondary | ICD-10-CM

## 2014-02-03 LAB — GLUCOSE, CAPILLARY
Glucose-Capillary: 186 mg/dL — ABNORMAL HIGH (ref 70–99)
Glucose-Capillary: 306 mg/dL — ABNORMAL HIGH (ref 70–99)

## 2014-02-03 MED ORDER — INSULIN GLARGINE 100 UNIT/ML ~~LOC~~ SOLN: 18.0000 [IU] | SUBCUTANEOUS | Status: DC

## 2014-02-03 MED ORDER — POTASSIUM CHLORIDE CRYS ER 20 MEQ PO TBCR
40.0000 meq | EXTENDED_RELEASE_TABLET | Freq: Once | ORAL | Status: AC
  Administered 2014-02-03: 40 meq via ORAL
  Filled 2014-02-03: qty 2

## 2014-02-03 MED ORDER — CEPHALEXIN 500 MG PO CAPS: 500.0000 mg | ORAL_CAPSULE | Freq: Four times a day (QID) | ORAL | Status: DC

## 2014-02-03 NOTE — Progress Notes (Signed)
Patient was discharged home by MD order; discharged instructions  review and give to patient with care notes and prescriptions; IV DIC; skin intact; patient will be escorted to the car by a volunteer via wheelchair.  

## 2014-02-03 NOTE — Discharge Instructions (Signed)
Take 4 units of novolog with each meal plus sliding scale insulin:  CBG 70 - 120: 0 units CBG 121 - 150: 1 unit CBG 151 - 200: 2 units CBG 201 - 250: 3 units CBG 251 - 300: 5 units CBG 301 - 350: 7 units CBG 351 - 400: 9 units  Take sliding scale insulin with each meal and once again at bedtime.

## 2014-02-03 NOTE — Care Management Note (Signed)
    Page 1 of 1   02/03/2014     5:03:44 PM CARE MANAGEMENT NOTE 02/03/2014  Patient:  Angela Meyer,Angela Meyer   Account Number:  [id number]  Date Initiated:  01/31/2014  Documentation initiated by:  Donn PieriniWEBSTER,KRISTI  Subjective/Objective Assessment:   Pt admitted with DKA, Pyelonephritis  -     Action/Plan:   PTA pt lived at home alone- PCP- [facility]   Anticipated DC Date:  02/03/2014   Anticipated DC Plan:  HOME/SELF CARE      DC Planning Services  CM consult      Choice offered to / List presented to:             Status of service:  Completed, signed off Medicare Important Message given?  NO (If response is "NO", the following Medicare IM given date fields will be blank) Date Medicare IM given:   Medicare IM given by:   Date Additional Medicare IM given:   Additional Medicare IM given by:    Discharge Disposition:  HOME/SELF CARE  Per UR Regulation:  Reviewed for med. necessity/level of care/duration of stay  If discussed at Long Length of Stay Meetings, dates discussed:    Comments:

## 2014-02-03 NOTE — Discharge Summary (Signed)
Physician Discharge Summary  Angela A Huwe ZOX:[id number] DOB: 04-23-1996 DOA: 01/29/2014  PCP: Hoyle Sauer, MD  Admit date: 01/29/2014 Discharge date: 02/03/2014  Time spent: 45 minutes  Recommendations for Outpatient Follow-up:  1. Follow up for bacteremia and pyelonephritis and DKA 2. Please continue to adjust insulin as appropriate.  Discharge Diagnoses:  Principal Problem:   DKA, type 1 Active Problems:   Hyperthyroidism   Thyroiditis, autoimmune   Dehydration   UTI (urinary tract infection), bacterial   Pyelonephritis   Bacteremia due to Gram-negative bacteria   Discharge Condition: stable  Diet recommendation: carb modified.  Filed Weights   01/29/14 1432 01/31/14 1122  Weight: 54.432 kg (120 lb) 53.524 kg (118 lb)    History of present illness:  Angela Meyer is a 18 y.o. female h/o DM1, presents to the ED with N/V and abdominal pain. Symptoms onset 3 days ago. She has had multiple episodes of vomiting, vomit is non bloody. She reports that her BGLs have become elevated over the last couple of days despite no changes to medications and she dosent understand why as they have been well controlled for the past 6 months. Symptoms are associated with abdominal pain and fever.  Hospital Course:  DKA, type 1  - Patient was admitted, given IV fluids and IV insulin. Once her anion gap closed, she was transitioned to Lantus and SSI. -Please see below for further details  Active Problems:   Escherichia coli pyelonephritis with bacteremia  - Blood and urine cultures are positive for Escherichia coli, started on Rocephin 2g received 6 days in the hospital. - The patient's stay was extended because she continued to spike temperatures (102+).  A CT of the abdomen was checked and did not show any abscess or renal stones. Repeat blood cultures are negative at the time of discharge. At the time of discharge she has been afebrile for over 36 hours. - She will be discharged on 8  additional days of  keflex.    Diabetes-type 1- uncontrolled  - CBGs stable, continue with Lantus and NovoLog.  - A1c of 10.5  - Will request that Dr. Felipa Eth continue to adjust her insulin as appropriate.  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 Free T4 within normal limits.   Procedures:  none  Consultations:  none  Discharge Exam: Filed Vitals:   02/03/14 0605  BP: 114/75  Pulse: 62  Temp: 98.9 F (37.2 C)  Resp: 18   Gen Exam: Awake and alert with clear speech. Appears well. Neck: Supple, No JVD.  Chest: B/L Clear. No accessory muscle use. CVS: S1 S2 Regular, no murmurs.  Abdomen: soft, BS +, non tender, non distended. No CVA tenderness  Extremities: no edema, lower extremities warm to touch.  Neurologic: Non Focal.   Discharge Instructions       Discharge Instructions   Diet Carb Modified    Complete by:  As directed      Increase activity slowly    Complete by:  As directed             Medication List         cephALEXin 500 MG capsule  Commonly known as:  KEFLEX  Take 1 capsule (500 mg total) by mouth 4 (four) times daily.     insulin glargine 100 UNIT/ML injection  Commonly known as:  LANTUS  Inject 0.18 mLs (18 Units total) into the skin daily.     medroxyPROGESTERone 150  MG/ML injection  Commonly known as:  DEPO-PROVERA  Inject 150 mg into the muscle every 3 (three) months.     methimazole 5 MG tablet  Commonly known as:  TAPAZOLE  Take 15 mg by mouth 2 (two) times daily.     NOVOLOG FLEXPEN [place]  Inject 0-15 Units into the skin 4 (four) times daily as needed (blood sugar).       No Known Allergies Follow-up Information   Follow up with Hoyle Sauer, MD In 1 week.   Specialty:  Internal Medicine   Contact information:   [address] [facility] [place] [address] [phone]        The results of significant diagnostics from this  hospitalization (including imaging, microbiology, ancillary and laboratory) are listed below for reference.    Significant Diagnostic Studies: Ct Abdomen Pelvis Wo Contrast  02/02/2014   CLINICAL DATA:  Fevers  EXAM: CT ABDOMEN AND PELVIS WITHOUT CONTRAST  TECHNIQUE: Multidetector CT imaging of the abdomen and pelvis was performed following the standard protocol without IV contrast.  COMPARISON:  None.  FINDINGS: Lung bases show minimal left basilar atelectasis.  The liver, gallbladder, spleen, adrenal glands and pancreas are all normal in their CT appearance. Kidneys are well visualized bilaterally. No calculi or obstructive changes are noted. The bladder is well distended. No pelvic mass lesion is seen. The appendix is within normal limits. Scattered diverticular change is noted without diverticulitis. The bony structures are within normal limits.  IMPRESSION: Chronic changes without acute abdominal abnormality.  Minimal left basilar atelectasis.   Electronically Signed   By: Alcide Clever M.D.   On: 02/02/2014 08:25   Dg Chest 2 View  01/29/2014   CLINICAL DATA:  Abdominal pain. Nausea and vomiting. Fever. Possible arrhythmia. Diabetic.  EXAM: CHEST  2 VIEW  COMPARISON:  03/29/2007  FINDINGS: The heart size and mediastinal contours are within normal limits. Both lungs are clear. The visualized skeletal structures are unremarkable.  IMPRESSION: No active cardiopulmonary disease.  Improved appearance from priors.   Electronically Signed   By: Davonna Belling M.D.   On: 01/29/2014 15:39    Microbiology: Recent Results (from the past 240 hour(s))  URINE CULTURE     Status: None   Collection Time    01/29/14  2:53 PM      Result Value Ref Range Status   Specimen Description URINE, CLEAN CATCH   Final   Special Requests NONE   Final   Culture  Setup Time     Final   Value: 01/29/2014 23:06     Performed at [facility] Count     Final   Value: >=100,000 COLONIES/ML     Performed at  [facility]   Culture     Final   Value: ESCHERICHIA COLI     Performed at [facility]   Report Status 02/01/2014 FINAL   Final   Organism ID, Bacteria ESCHERICHIA COLI   Final  CULTURE, BLOOD (ROUTINE X 2)     Status: None   Collection Time    01/29/14  3:10 PM      Result Value Ref Range Status   Specimen Description BLOOD LEFT ANTECUBITAL   Final   Special Requests BOTTLES DRAWN AEROBIC AND ANAEROBIC 10CC   Final   Culture  Setup Time     Final   Value: 01/29/2014 22:48     Performed at [facility]  Final   Value: ESCHERICHIA COLI     Note: Gram Stain Report Called to,Read Back By and Verified With: CAROL SCHILLER@0943  ON [id number] BY [facility]     Performed at [facility]   Report Status 02/01/2014 FINAL   Final   Organism ID, Bacteria ESCHERICHIA COLI   Final  CULTURE, BLOOD (ROUTINE X 2)     Status: None   Collection Time    01/29/14  4:02 PM      Result Value Ref Range Status   Specimen Description BLOOD RIGHT ARM   Final   Special Requests BOTTLES DRAWN AEROBIC AND ANAEROBIC 10CC EACH   Final   Culture  Setup Time     Final   Value: 01/29/2014 22:48     Performed at [facility]   Culture     Final   Value: ESCHERICHIA COLI     Note: SUSCEPTIBILITIES PERFORMED ON PREVIOUS CULTURE WITHIN THE LAST 5 DAYS.     Note: Gram Stain Report Called to,Read Back By and Verified With: CAROL SCHILLER@0943  ON [id number] BY [facility]     Performed at [facility]   Report Status 02/01/2014 FINAL   Final  MRSA PCR SCREENING     Status: None   Collection Time    01/29/14 10:28 PM      Result Value Ref Range Status   MRSA by PCR NEGATIVE  NEGATIVE Final   Comment:            The GeneXpert MRSA Assay (FDA     approved for NASAL specimens     only), is one component of a     comprehensive MRSA colonization     surveillance program. It is not     intended to diagnose MRSA     infection nor to guide or     monitor treatment for      MRSA infections.  CULTURE, BLOOD (ROUTINE X 2)     Status: None   Collection Time    02/01/14  4:20 PM      Result Value Ref Range Status   Specimen Description BLOOD RIGHT ARM   Final   Special Requests BOTTLES DRAWN AEROBIC ONLY 6CC   Final   Culture  Setup Time     Final   Value: 02/01/2014 21:28     Performed at [facility]   Culture     Final   Value:        BLOOD CULTURE RECEIVED NO GROWTH TO DATE CULTURE WILL BE HELD FOR 5 DAYS BEFORE ISSUING A FINAL NEGATIVE REPORT     Performed at [facility]   Report Status PENDING   Incomplete  CULTURE, BLOOD (ROUTINE X 2)     Status: None   Collection Time    02/01/14  4:35 PM      Result Value Ref Range Status   Specimen Description BLOOD LEFT HAND   Final   Special Requests BOTTLES DRAWN AEROBIC ONLY 5CC   Final   Culture  Setup Time     Final   Value: 02/01/2014 21:28     Performed at [facility]   Culture     Final   Value:        BLOOD CULTURE RECEIVED NO GROWTH TO DATE CULTURE WILL BE HELD FOR 5 DAYS BEFORE ISSUING A FINAL NEGATIVE REPORT     Performed at [facility]   Report Status PENDING   Incomplete  Labs: Basic Metabolic Panel:  Recent Labs Lab 01/30/14 1341 01/30/14 1610 01/30/14 2010 01/31/14 0228 02/02/14 0633  NA 133* 132* 132* 133* 138  K 3.3* 3.2* 3.0* 3.9 3.0*  CL 102 100 98 99 98  CO2 19 19 18* 20 24  GLUCOSE 168* 250* 189* 370* 175*  BUN 3* 3* 3* 5* 3*  CREATININE 0.58 0.62 0.59 0.64 0.56  CALCIUM 8.4 8.3* 8.0* 7.9* 8.5   Liver Function Tests:  Recent Labs Lab 01/29/14 1510  AST 14  ALT 13  ALKPHOS 80  BILITOT 0.7  PROT 8.2  ALBUMIN 4.0   CBC:  Recent Labs Lab 01/29/14 1510 01/29/14 2056 01/29/14 2255 01/31/14 0228 02/02/14 0633  WBC 13.0* 9.4 10.1 8.1 7.3  NEUTROABS 10.4*  --   --   --  3.5  HGB 13.9 12.2 11.2* 11.3* 11.3*  HCT 41.0 36.0 33.0* 33.8* 33.3*  MCV 83.7 83.1 81.3 81.8 81.4  PLT 265 228 228 243 328   CBG:  Recent Labs Lab  02/02/14 0750 02/02/14 1134 02/02/14 1658 02/02/14 2128 02/03/14 0800  GLUCAP 171* 255* 253* 191* 186*    Signed:  Conley CanalYork, Marianne L, PA-C [phone]  [facility] 02/03/2014, 9:56 AM  Attending Patient was seen, examined,treatment plan was discussed with the Physician extender. I have directly reviewed the clinical findings, lab, imaging studies and management of this patient in detail. I have made the necessary changes to the above noted documentation, and agree with the documentation, as recorded by the Physician extender.  Windell NorfolkS Margerite Impastato MD [facility].

## 2014-02-07 LAB — CULTURE, BLOOD (ROUTINE X 2)
Culture: NO GROWTH
Culture: NO GROWTH

## 2016-03-06 IMAGING — CT CT ABD-PELV W/O CM
2 of 4 series · 14 of 46 positions shown, 16 images · non-contrast
Comparison: None.

CLINICAL DATA: Fevers

EXAM:
CT ABDOMEN AND PELVIS WITHOUT CONTRAST
TECHNIQUE: Multidetector CT imaging of the abdomen and pelvis was performed
following the standard protocol without IV contrast.

[Series 201: routine, idose (2) · axial · 0.67mm/px · z∈[-414,-64]mm · 11 of 84 slices shown, 13 images]
[im 7/84  soft-tissue]
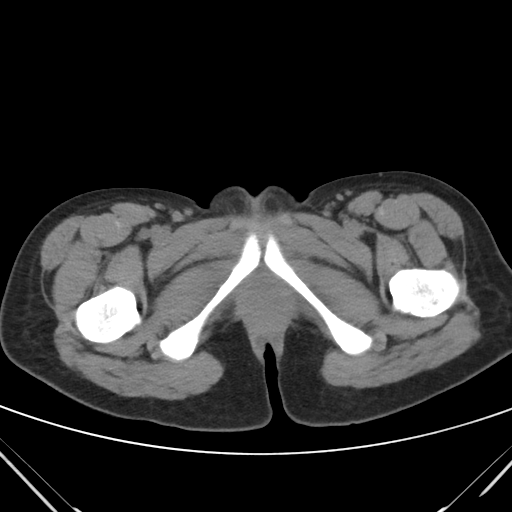
[im 7/84  bone]
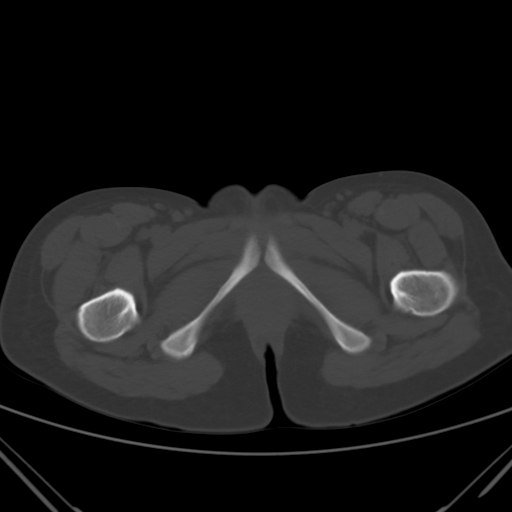
[im 14/84  soft-tissue]
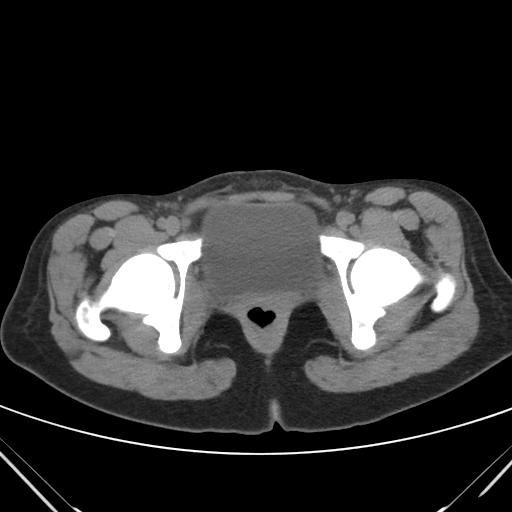
[im 20/84  soft-tissue]
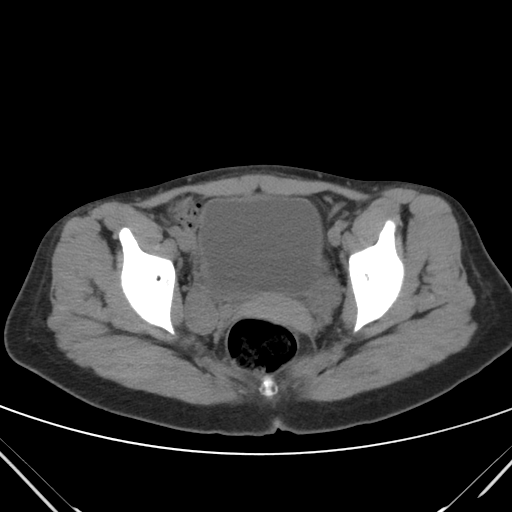
[im 27/84  soft-tissue]
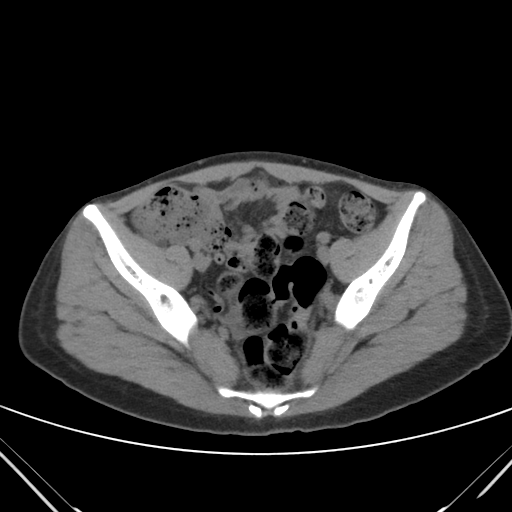
[im 34/84  soft-tissue]
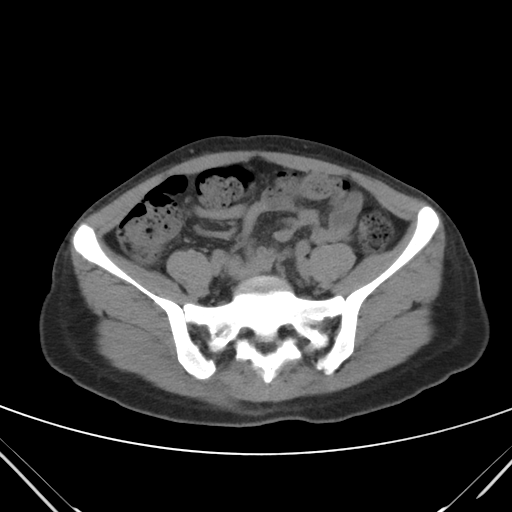
[im 44/84  soft-tissue]
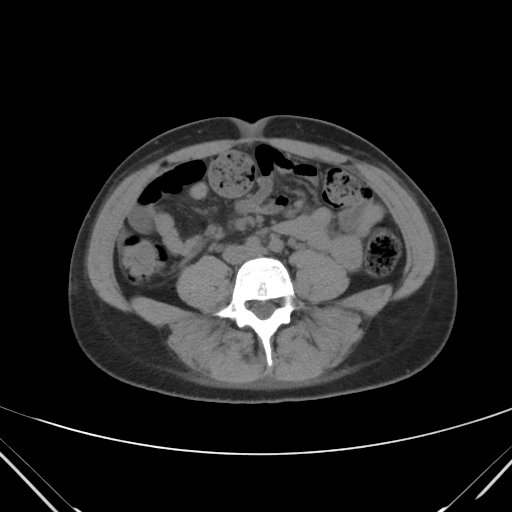
[im 50/84  soft-tissue]
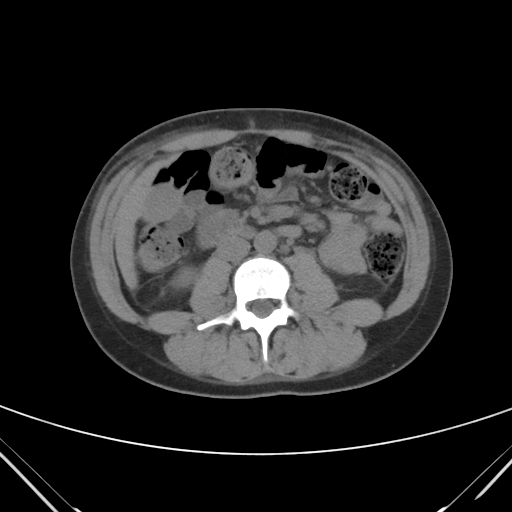
[im 57/84  soft-tissue]
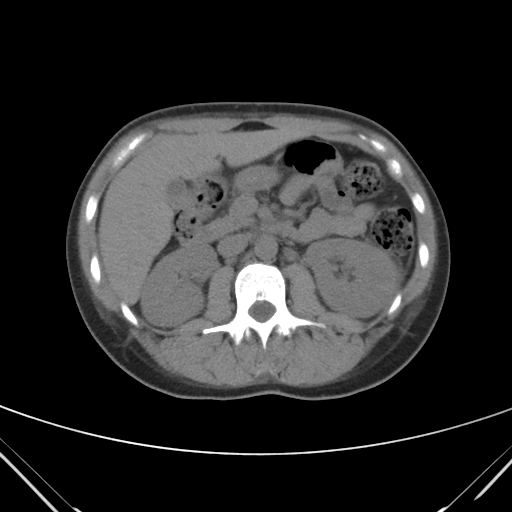
[im 64/84  soft-tissue]
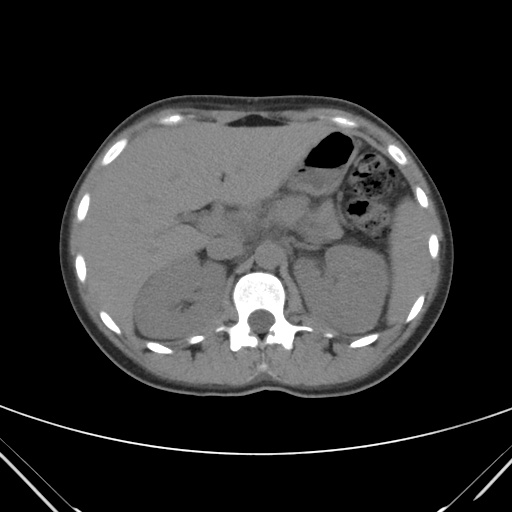
[im 64/84  bone]
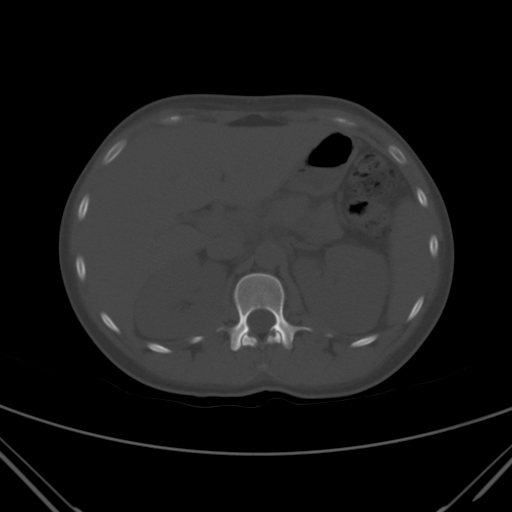
[im 70/84  soft-tissue]
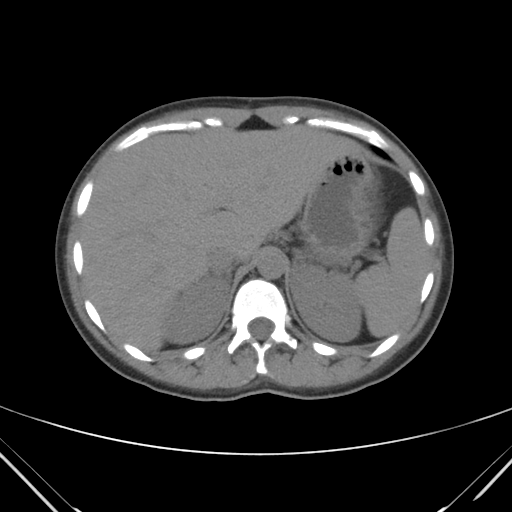
[im 77/84  soft-tissue]
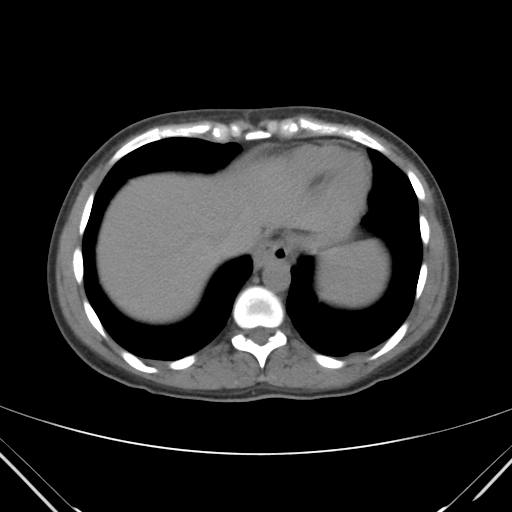

[Series 203: coronals, idose (2) · coronal · 0.50mm/px · 3 of 98 slices shown]
[im 33/98  soft-tissue]
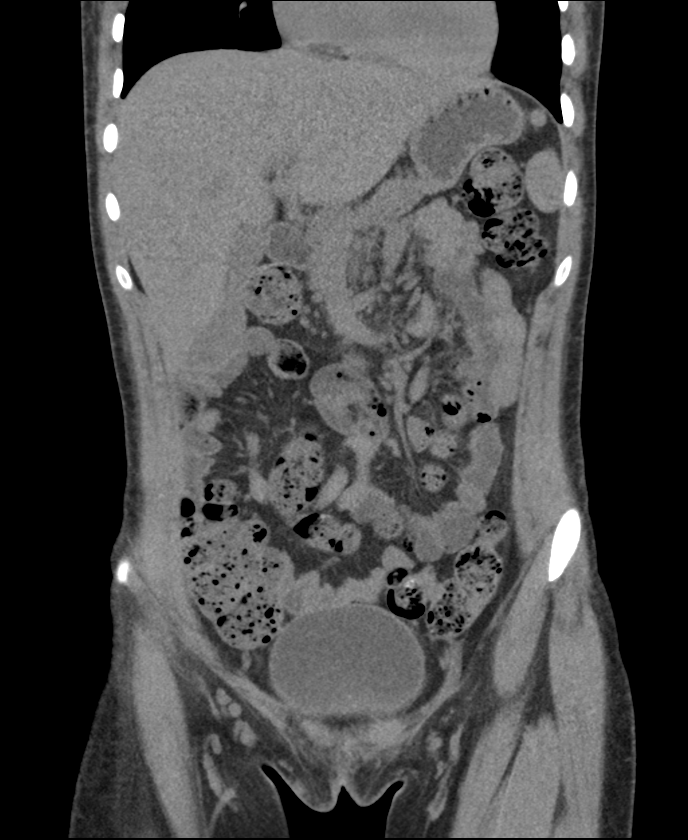
[im 44/98  soft-tissue]
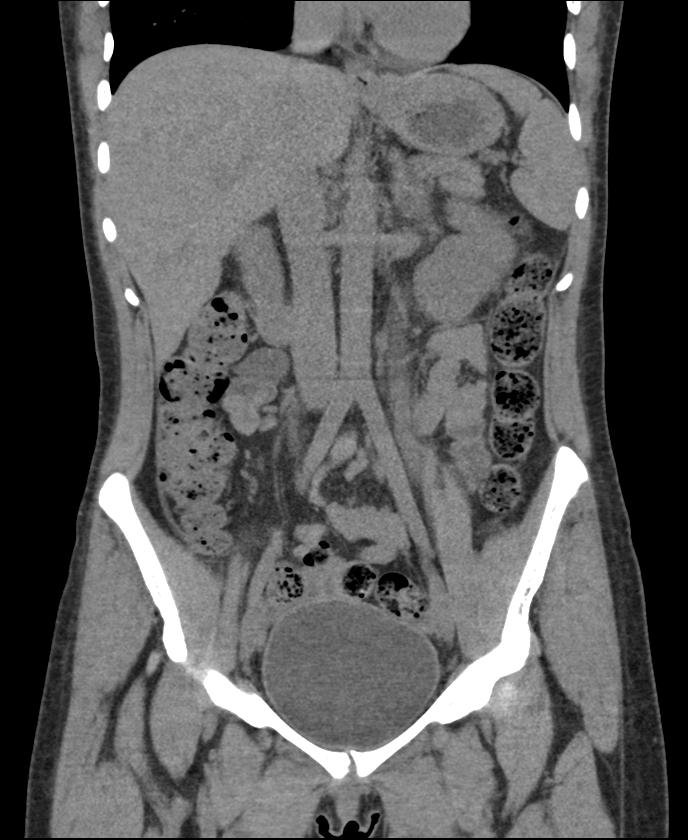
[im 54/98  soft-tissue]
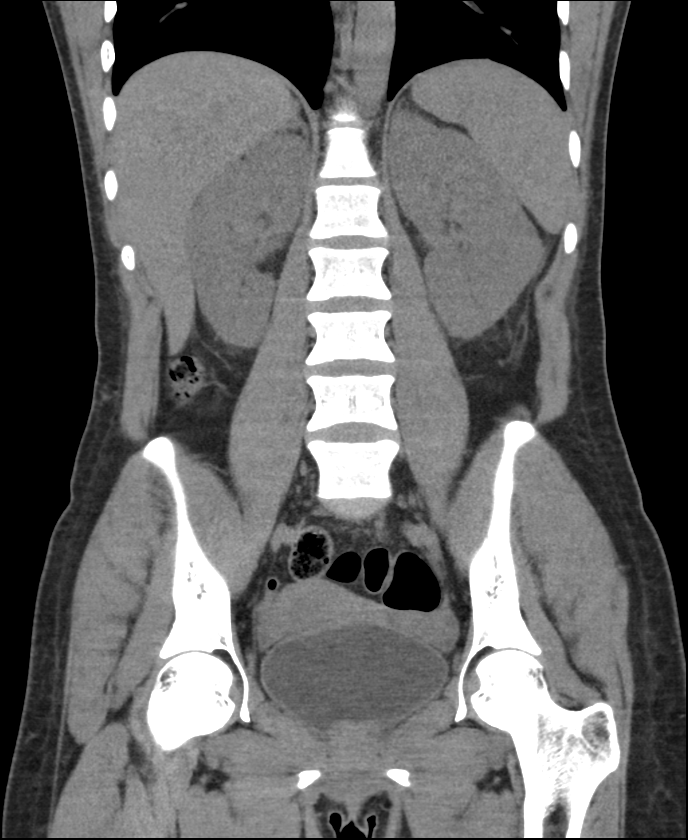

[14 of 46 positions shown; findings below may reference images not displayed]

FINDINGS: Lung bases show minimal left basilar atelectasis.

The liver, gallbladder, spleen, adrenal glands and pancreas are all
normal in their CT appearance. Kidneys are well visualized
bilaterally. No calculi or obstructive changes are noted. The
bladder is well distended. No pelvic mass lesion is seen. The
appendix is within normal limits. Scattered diverticular change is
noted without diverticulitis. The bony structures are within normal
limits.
IMPRESSION: Chronic changes without acute abdominal abnormality.

Minimal left basilar atelectasis.

## 2016-03-27 DIAGNOSIS — Z114 Encounter for screening for human immunodeficiency virus [HIV]: Secondary | ICD-10-CM | POA: Diagnosis not present

## 2016-03-27 DIAGNOSIS — Z01419 Encounter for gynecological examination (general) (routine) without abnormal findings: Secondary | ICD-10-CM | POA: Diagnosis not present

## 2016-03-27 DIAGNOSIS — Z1159 Encounter for screening for other viral diseases: Secondary | ICD-10-CM | POA: Diagnosis not present

## 2016-03-27 DIAGNOSIS — R87610 Atypical squamous cells of undetermined significance on cytologic smear of cervix (ASC-US): Secondary | ICD-10-CM | POA: Diagnosis not present

## 2016-03-27 DIAGNOSIS — Z113 Encounter for screening for infections with a predominantly sexual mode of transmission: Secondary | ICD-10-CM | POA: Diagnosis not present

## 2016-03-27 DIAGNOSIS — Z975 Presence of (intrauterine) contraceptive device: Secondary | ICD-10-CM | POA: Diagnosis not present

## 2016-04-22 DIAGNOSIS — E119 Type 2 diabetes mellitus without complications: Secondary | ICD-10-CM | POA: Diagnosis not present

## 2016-04-22 DIAGNOSIS — Z794 Long term (current) use of insulin: Secondary | ICD-10-CM | POA: Diagnosis not present

## 2016-06-13 DIAGNOSIS — Z794 Long term (current) use of insulin: Secondary | ICD-10-CM | POA: Diagnosis not present

## 2016-06-13 DIAGNOSIS — Z113 Encounter for screening for infections with a predominantly sexual mode of transmission: Secondary | ICD-10-CM | POA: Diagnosis not present

## 2016-06-13 DIAGNOSIS — A749 Chlamydial infection, unspecified: Secondary | ICD-10-CM | POA: Diagnosis not present

## 2016-06-13 DIAGNOSIS — E119 Type 2 diabetes mellitus without complications: Secondary | ICD-10-CM | POA: Diagnosis not present

## 2016-06-13 DIAGNOSIS — Z3046 Encounter for surveillance of implantable subdermal contraceptive: Secondary | ICD-10-CM | POA: Diagnosis not present

## 2016-09-06 DIAGNOSIS — R509 Fever, unspecified: Secondary | ICD-10-CM | POA: Diagnosis not present

## 2016-09-06 DIAGNOSIS — R05 Cough: Secondary | ICD-10-CM | POA: Diagnosis not present

## 2016-09-06 DIAGNOSIS — B349 Viral infection, unspecified: Secondary | ICD-10-CM | POA: Diagnosis not present

## 2016-09-06 DIAGNOSIS — E1010 Type 1 diabetes mellitus with ketoacidosis without coma: Secondary | ICD-10-CM | POA: Diagnosis not present

## 2016-09-07 DIAGNOSIS — R079 Chest pain, unspecified: Secondary | ICD-10-CM | POA: Diagnosis not present

## 2016-09-07 DIAGNOSIS — R509 Fever, unspecified: Secondary | ICD-10-CM | POA: Diagnosis not present

## 2016-09-07 DIAGNOSIS — K529 Noninfective gastroenteritis and colitis, unspecified: Secondary | ICD-10-CM | POA: Diagnosis not present

## 2016-09-07 DIAGNOSIS — Z794 Long term (current) use of insulin: Secondary | ICD-10-CM | POA: Diagnosis not present

## 2016-09-07 DIAGNOSIS — E1010 Type 1 diabetes mellitus with ketoacidosis without coma: Secondary | ICD-10-CM | POA: Diagnosis not present

## 2016-09-07 DIAGNOSIS — R05 Cough: Secondary | ICD-10-CM | POA: Diagnosis not present

## 2016-09-07 DIAGNOSIS — E0500 Thyrotoxicosis with diffuse goiter without thyrotoxic crisis or storm: Secondary | ICD-10-CM | POA: Diagnosis not present

## 2016-09-07 DIAGNOSIS — E876 Hypokalemia: Secondary | ICD-10-CM | POA: Diagnosis not present

## 2016-09-08 DIAGNOSIS — R509 Fever, unspecified: Secondary | ICD-10-CM | POA: Diagnosis not present

## 2016-09-26 DIAGNOSIS — Z30011 Encounter for initial prescription of contraceptive pills: Secondary | ICD-10-CM | POA: Diagnosis not present

## 2016-09-26 DIAGNOSIS — E1010 Type 1 diabetes mellitus with ketoacidosis without coma: Secondary | ICD-10-CM | POA: Diagnosis not present

## 2016-09-26 DIAGNOSIS — Z7689 Persons encountering health services in other specified circumstances: Secondary | ICD-10-CM | POA: Diagnosis not present

## 2016-09-26 DIAGNOSIS — Z202 Contact with and (suspected) exposure to infections with a predominantly sexual mode of transmission: Secondary | ICD-10-CM | POA: Diagnosis not present

## 2016-10-20 DIAGNOSIS — Z5321 Procedure and treatment not carried out due to patient leaving prior to being seen by health care provider: Secondary | ICD-10-CM | POA: Diagnosis not present

## 2016-10-20 DIAGNOSIS — N939 Abnormal uterine and vaginal bleeding, unspecified: Secondary | ICD-10-CM | POA: Diagnosis not present

## 2016-10-20 DIAGNOSIS — R10819 Abdominal tenderness, unspecified site: Secondary | ICD-10-CM | POA: Diagnosis not present

## 2017-04-03 DIAGNOSIS — N926 Irregular menstruation, unspecified: Secondary | ICD-10-CM | POA: Diagnosis not present

## 2017-04-03 DIAGNOSIS — Z6821 Body mass index (BMI) 21.0-21.9, adult: Secondary | ICD-10-CM | POA: Diagnosis not present

## 2017-04-03 DIAGNOSIS — Z3202 Encounter for pregnancy test, result negative: Secondary | ICD-10-CM | POA: Diagnosis not present

## 2017-04-03 DIAGNOSIS — Z01419 Encounter for gynecological examination (general) (routine) without abnormal findings: Secondary | ICD-10-CM | POA: Diagnosis not present

## 2017-06-04 DIAGNOSIS — N911 Secondary amenorrhea: Secondary | ICD-10-CM | POA: Diagnosis not present

## 2017-06-04 DIAGNOSIS — Z113 Encounter for screening for infections with a predominantly sexual mode of transmission: Secondary | ICD-10-CM | POA: Diagnosis not present

## 2017-06-04 DIAGNOSIS — Z3009 Encounter for other general counseling and advice on contraception: Secondary | ICD-10-CM | POA: Diagnosis not present

## 2017-06-04 DIAGNOSIS — Z3200 Encounter for pregnancy test, result unknown: Secondary | ICD-10-CM | POA: Diagnosis not present

## 2017-06-04 DIAGNOSIS — A749 Chlamydial infection, unspecified: Secondary | ICD-10-CM | POA: Diagnosis not present

## 2017-06-04 DIAGNOSIS — R87610 Atypical squamous cells of undetermined significance on cytologic smear of cervix (ASC-US): Secondary | ICD-10-CM | POA: Diagnosis not present

## 2017-06-29 ENCOUNTER — Encounter (HOSPITAL_COMMUNITY): Payer: Self-pay | Admitting: Emergency Medicine

## 2017-06-29 ENCOUNTER — Other Ambulatory Visit: Payer: Self-pay

## 2017-06-29 ENCOUNTER — Ambulatory Visit (HOSPITAL_COMMUNITY)
Admission: EM | Admit: 2017-06-29 | Discharge: 2017-06-29 | Disposition: A | Discharge status: Home / Self Care | Payer: BLUE CROSS/BLUE SHIELD | Attending: Family Medicine | Admitting: Family Medicine

## 2017-06-29 DIAGNOSIS — J111 Influenza due to unidentified influenza virus with other respiratory manifestations: Secondary | ICD-10-CM

## 2017-06-29 MED ORDER — HYDROCODONE-HOMATROPINE 5-1.5 MG/5ML PO SYRP: 5.0000 mL | ORAL_SOLUTION | Freq: Four times a day (QID) | ORAL | 0 refills | Status: DC | PRN

## 2017-06-29 MED ORDER — OSELTAMIVIR PHOSPHATE 75 MG PO CAPS: 75.0000 mg | ORAL_CAPSULE | Freq: Two times a day (BID) | ORAL | 0 refills | Status: DC

## 2017-06-29 NOTE — ED Triage Notes (Signed)
Pt reports nasal congestion and pressure, headache and cough since last night.  She also reports hot and cold flash.

## 2017-06-29 NOTE — Discharge Instructions (Signed)

## 2017-07-01 NOTE — ED Provider Notes (Signed)
[facility]   [id number] 06/29/17 Arrival Time: 1128  ASSESSMENT & PLAN:  1. Influenza     Meds ordered this encounter  Medications  . oseltamivir (TAMIFLU) 75 MG capsule    Sig: Take 1 capsule (75 mg total) by mouth every 12 (twelve) hours.    Dispense:  10 capsule    Refill:  0  . HYDROcodone-homatropine (HYCODAN) 5-1.5 MG/5ML syrup    Sig: Take 5 mLs by mouth every 6 (six) hours as needed for cough.    Dispense:  90 mL    Refill:  0    Medication sedation precautions. OTC symptom care as needed. Ensure adequate fluid intake and rest. May f/u with PCP or here as needed.  Reviewed expectations re: course of current medical issues. Questions answered. Outlined signs and symptoms indicating need for more acute intervention. Patient verbalized understanding. After Visit Summary given.   SUBJECTIVE: History from: patient.  Angela Meyer is a 22 y.o. female who presents with complaint of nasal congestion, post-nasal drainage, and a persistent dry cough. Onset abrupt, approximately 1-2 days ago. Overall fatigued with body aches. SOB: none. Wheezing: none. Fever: yes. Overall normal PO intake without n/v. Sick contacts: no. OTC treatment: Cough medication without relief. Received flu shot this year: no. Social History   Tobacco Use  Smoking Status Never Smoker  Smokeless Tobacco Never Used    ROS: As per HPI.   OBJECTIVE:  Vitals:   06/29/17 1145  BP: 128/90  Pulse: (!) 103  Temp: (!) 97.2 F (36.2 C)  TempSrc: Oral  SpO2: 98%    Febrile General appearance: alert; appears fatigued HEENT: nasal congestion; clear runny nose; throat irritation secondary to post-nasal drainage Neck: supple without LAD Lungs: unlabored respirations, symmetrical air entry; cough: moderate; no respiratory distress Skin: warm and dry Psychological: alert and cooperative; normal mood and affect .  No Known Allergies  Past Medical History:  Diagnosis Date  .  Chronic autoimmune thyroiditis   . Diabetes mellitus   . Dyspepsia   . Fetal tachycardia before the onset of labor   . Goiter with hyperthyroidism   . Irritability   . Irritability   . Tachycardia   . Thyroiditis, autoimmune   . Thyrotoxicosis with diffuse goiter   . Tremor   . Tremor   . Weight loss   . Weight loss, abnormal    Family History  Problem Relation Age of Onset  . Diabetes Father   . Diabetes Paternal Aunt   . Thyroid disease Maternal Grandmother   . Heart disease Maternal Grandfather    Social History   Socioeconomic History  . Marital status: Single    Spouse name: Not on file  . Number of children: Not on file  . Years of education: Not on file  . Highest education level: Not on file  Social Needs  . Financial resource strain: Not on file  . Food insecurity - worry: Not on file  . Food insecurity - inability: Not on file  . Transportation needs - medical: Not on file  . Transportation needs - non-medical: Not on file  Occupational History  . Not on file  Tobacco Use  . Smoking status: Never Smoker  . Smokeless tobacco: Never Used  Substance and Sexual Activity  . Alcohol use: No  . Drug use: No  . Sexual activity: Not on file  Other Topics Concern  . Not on file  Social History Narrative  . Not on file  Mardella Layman, MD 07/01/17 [phone]

## 2017-07-10 DIAGNOSIS — E0590 Thyrotoxicosis, unspecified without thyrotoxic crisis or storm: Secondary | ICD-10-CM | POA: Diagnosis not present

## 2017-07-10 DIAGNOSIS — E109 Type 1 diabetes mellitus without complications: Secondary | ICD-10-CM | POA: Diagnosis not present

## 2017-07-23 DIAGNOSIS — E1065 Type 1 diabetes mellitus with hyperglycemia: Secondary | ICD-10-CM | POA: Diagnosis not present

## 2017-07-23 DIAGNOSIS — E0500 Thyrotoxicosis with diffuse goiter without thyrotoxic crisis or storm: Secondary | ICD-10-CM | POA: Diagnosis not present

## 2017-11-20 DIAGNOSIS — Z789 Other specified health status: Secondary | ICD-10-CM | POA: Diagnosis not present

## 2017-11-20 DIAGNOSIS — E1065 Type 1 diabetes mellitus with hyperglycemia: Secondary | ICD-10-CM | POA: Diagnosis not present

## 2017-11-20 DIAGNOSIS — E0500 Thyrotoxicosis with diffuse goiter without thyrotoxic crisis or storm: Secondary | ICD-10-CM | POA: Diagnosis not present

## 2017-12-09 DIAGNOSIS — E0500 Thyrotoxicosis with diffuse goiter without thyrotoxic crisis or storm: Secondary | ICD-10-CM | POA: Diagnosis not present

## 2017-12-09 DIAGNOSIS — E1065 Type 1 diabetes mellitus with hyperglycemia: Secondary | ICD-10-CM | POA: Diagnosis not present

## 2018-01-04 DIAGNOSIS — E0500 Thyrotoxicosis with diffuse goiter without thyrotoxic crisis or storm: Secondary | ICD-10-CM | POA: Diagnosis not present

## 2018-01-07 DIAGNOSIS — E0500 Thyrotoxicosis with diffuse goiter without thyrotoxic crisis or storm: Secondary | ICD-10-CM | POA: Diagnosis not present

## 2018-01-07 DIAGNOSIS — E1065 Type 1 diabetes mellitus with hyperglycemia: Secondary | ICD-10-CM | POA: Diagnosis not present

## 2018-03-16 DIAGNOSIS — Z118 Encounter for screening for other infectious and parasitic diseases: Secondary | ICD-10-CM | POA: Diagnosis not present

## 2018-03-16 DIAGNOSIS — Z114 Encounter for screening for human immunodeficiency virus [HIV]: Secondary | ICD-10-CM | POA: Diagnosis not present

## 2018-03-16 DIAGNOSIS — Z1159 Encounter for screening for other viral diseases: Secondary | ICD-10-CM | POA: Diagnosis not present

## 2018-03-16 DIAGNOSIS — Z1151 Encounter for screening for human papillomavirus (HPV): Secondary | ICD-10-CM | POA: Diagnosis not present

## 2018-03-16 DIAGNOSIS — Z113 Encounter for screening for infections with a predominantly sexual mode of transmission: Secondary | ICD-10-CM | POA: Diagnosis not present

## 2018-03-16 DIAGNOSIS — N760 Acute vaginitis: Secondary | ICD-10-CM | POA: Diagnosis not present

## 2018-03-17 DIAGNOSIS — B360 Pityriasis versicolor: Secondary | ICD-10-CM | POA: Diagnosis not present

## 2018-04-06 DIAGNOSIS — Z6823 Body mass index (BMI) 23.0-23.9, adult: Secondary | ICD-10-CM | POA: Diagnosis not present

## 2018-04-06 DIAGNOSIS — Z01419 Encounter for gynecological examination (general) (routine) without abnormal findings: Secondary | ICD-10-CM | POA: Diagnosis not present

## 2018-04-20 ENCOUNTER — Encounter (HOSPITAL_COMMUNITY): Payer: Self-pay | Admitting: Emergency Medicine

## 2018-04-20 ENCOUNTER — Ambulatory Visit (HOSPITAL_COMMUNITY)
Admission: EM | Admit: 2018-04-20 | Discharge: 2018-04-20 | Disposition: A | Discharge status: Home / Self Care | Payer: BLUE CROSS/BLUE SHIELD | Attending: Family Medicine | Admitting: Family Medicine

## 2018-04-20 DIAGNOSIS — M79642 Pain in left hand: Secondary | ICD-10-CM | POA: Diagnosis not present

## 2018-04-20 DIAGNOSIS — E109 Type 1 diabetes mellitus without complications: Secondary | ICD-10-CM | POA: Diagnosis not present

## 2018-04-20 DIAGNOSIS — R202 Paresthesia of skin: Secondary | ICD-10-CM

## 2018-04-20 DIAGNOSIS — Z79899 Other long term (current) drug therapy: Secondary | ICD-10-CM | POA: Insufficient documentation

## 2018-04-20 DIAGNOSIS — M79671 Pain in right foot: Secondary | ICD-10-CM | POA: Diagnosis not present

## 2018-04-20 DIAGNOSIS — Z833 Family history of diabetes mellitus: Secondary | ICD-10-CM | POA: Insufficient documentation

## 2018-04-20 DIAGNOSIS — M79609 Pain in unspecified limb: Secondary | ICD-10-CM | POA: Insufficient documentation

## 2018-04-20 DIAGNOSIS — Z794 Long term (current) use of insulin: Secondary | ICD-10-CM | POA: Diagnosis not present

## 2018-04-20 DIAGNOSIS — M79672 Pain in left foot: Secondary | ICD-10-CM

## 2018-04-20 DIAGNOSIS — E1069 Type 1 diabetes mellitus with other specified complication: Secondary | ICD-10-CM

## 2018-04-20 LAB — COMPREHENSIVE METABOLIC PANEL
ALK PHOS: 46 U/L (ref 38–126)
ALT: 13 U/L (ref 0–44)
AST: 15 U/L (ref 15–41)
Albumin: 3.9 g/dL (ref 3.5–5.0)
Anion gap: 10 (ref 5–15)
BUN: 13 mg/dL (ref 6–20)
CALCIUM: 9.1 mg/dL (ref 8.9–10.3)
CO2: 22 mmol/L (ref 22–32)
CREATININE: 0.65 mg/dL (ref 0.44–1.00)
Chloride: 102 mmol/L (ref 98–111)
GFR calc Af Amer: >60 mL/min (ref >60)
Glucose, Bld: 270 mg/dL — ABNORMAL HIGH (ref 70–99)
Potassium: 3.7 mmol/L (ref 3.5–5.1)
SODIUM: 134 mmol/L — AB (ref 135–145)
Total Bilirubin: 0.7 mg/dL (ref 0.3–1.2)
Total Protein: 6.8 g/dL (ref 6.5–8.1)

## 2018-04-20 LAB — CBC WITH DIFFERENTIAL/PLATELET
ABS IMMATURE GRANULOCYTES: 0.02 10*3/uL (ref 0.00–0.07)
BASOS PCT: 1 %
Basophils Absolute: 0 10*3/uL (ref 0.0–0.1)
Eosinophils Absolute: 0.2 10*3/uL (ref 0.0–0.5)
Eosinophils Relative: 4 %
HCT: 40.7 % (ref 36.0–46.0)
HEMOGLOBIN: 13.7 g/dL (ref 12.0–15.0)
IMMATURE GRANULOCYTES: 0 %
LYMPHS PCT: 35 %
Lymphs Abs: 1.6 10*3/uL (ref 0.7–4.0)
MCH: 28.4 pg (ref 26.0–34.0)
MCHC: 33.7 g/dL (ref 30.0–36.0)
MCV: 84.4 fL (ref 80.0–100.0)
Monocytes Absolute: 0.4 10*3/uL (ref 0.1–1.0)
Monocytes Relative: 8 %
NEUTROS ABS: 2.4 10*3/uL (ref 1.7–7.7)
NEUTROS PCT: 52 %
PLATELETS: 359 10*3/uL (ref 150–400)
RBC: 4.82 MIL/uL (ref 3.87–5.11)
RDW: 12.3 % (ref 11.5–15.5)
WBC: 4.6 10*3/uL (ref 4.0–10.5)
nRBC: 0 % (ref 0.0–0.2)

## 2018-04-20 LAB — TSH: TSH: 0.832 u[IU]/mL (ref 0.350–4.500)

## 2018-04-20 LAB — T4, FREE: Free T4: 0.54 ng/dL — ABNORMAL LOW (ref 0.82–1.77)

## 2018-04-20 LAB — VITAMIN B12: Vitamin B-12: 395 pg/mL (ref 180–914)

## 2018-04-20 LAB — HEMOGLOBIN A1C
Hgb A1c MFr Bld: 9.7 % — ABNORMAL HIGH (ref 4.8–5.6)
Mean Plasma Glucose: 231.69 mg/dL

## 2018-04-20 MED ORDER — GABAPENTIN 300 MG PO CAPS: 300.0000 mg | ORAL_CAPSULE | Freq: Three times a day (TID) | ORAL | 0 refills | Status: DC

## 2018-04-20 NOTE — ED Triage Notes (Signed)
Pt sts tingling in bilateral hands and feet starting last night

## 2018-04-20 NOTE — ED Provider Notes (Signed)
[facility]    CSN: [id number] Arrival date & time: 04/20/18  4098     History   Chief Complaint Chief Complaint  Patient presents with  . Tingling    HPI Angela Meyer is a 22 y.o. female.   HPI Patient is a type I diabetic.  She started with diabetes at age 81, then had a "honeymoon period", then had insulin requiring diabetes again by the age of 21.  She is now 22.  She states her diabetes is well controlled.  She does not know her last A1c.  Her blood sugar this morning was 105.  No recent cough cold or infection.  No fevers.  No nausea vomiting diarrhea.  She states she eats a well-balanced diet with no restrictions except for carbohydrate. She also has hyperthyroidism.  She is on methimazole 15 mg twice daily.  She is under the care of an endocrinologist.  She missed her last endocrinology appointment and lab work.  She states she has insurance problems with this endocrinologist.  She wishes to be referred to a new endocrinologist.  She has some insurance difficulty.  She is overdue for monitoring and lab work. Her history is that she started with pain in her hands and feet last night.  Insists intense tingling and prickling painful sensation in all 10 fingers in all 10 toes.  She has no idea what might have caused this.  She had a normal day.  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  No new medicines.  No new exposures.  No high sugars. Past Medical History:  Diagnosis Date  . Chronic autoimmune thyroiditis   . Diabetes mellitus   . Dyspepsia   . Fetal tachycardia before the onset of labor   . Goiter with hyperthyroidism   . Irritability   . Irritability   . Tachycardia   . Thyroiditis, autoimmune   . Thyrotoxicosis with diffuse goiter   . Tremor   . Tremor   . Weight loss   . Weight loss, abnormal     Patient Active Problem List   Diagnosis Date Noted  . Pyelonephritis 01/30/2014  . Bacteremia due to Gram-negative  bacteria 01/30/2014  . UTI (urinary tract infection), bacterial 01/29/2014  . DKA, type 1 (HCC) 01/29/2014  . Diabetes type 1, uncontrolled (HCC) 11/26/2010  . Dehydration 11/26/2010  . 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 Thyroiditis, autoimmune   . Hyperthyroidism 10/14/2010    History reviewed. No pertinent surgical history.  OB History   None      Home Medications    Prior to Admission medications   Medication Sig Start Date End Date Taking? Authorizing Provider  gabapentin (NEURONTIN) 300 MG capsule Take 1 capsule (300 mg total) by mouth 3 (three) times daily. Start with one a day, advance as directed 04/20/18   Eustace Moore, MD  Insulin Aspart (NOVOLOG FLEXPEN [place]) Inject 0-15 Units into the skin 4 (four) times daily as needed (blood sugar).     [provider]  insulin glargine (LANTUS) 100 UNIT/ML injection Inject 0.18 mLs (18 Units total) into the skin daily. 02/03/14   Dellinger, Tora Kindred, PA-C  methimazole (TAPAZOLE) 5 MG tablet Take 15 mg by mouth 2 (two) times daily.    [provider]    Family History Family History  Problem Relation Age of Onset  . Diabetes Father   . Diabetes Paternal Aunt   . Thyroid disease Maternal Grandmother   .  Heart disease Maternal Grandfather     Social History Social History   Tobacco Use  . Smoking status: Never Smoker  . Smokeless tobacco: Never Used  Substance Use Topics  . Alcohol use: No  . Drug use: No     Allergies   Patient has no known allergies.   Review of Systems Review of Systems  Constitutional: Negative for chills and fever.  HENT: Negative for ear pain and sore throat.   Eyes: Negative for pain and visual disturbance.  Respiratory: Negative for cough and shortness of breath.   Cardiovascular: Negative for chest pain and palpitations.  Gastrointestinal: Negative for abdominal pain and vomiting.  Endocrine: Negative for cold intolerance, heat intolerance, polydipsia and polyuria.    Genitourinary: Negative for dysuria and hematuria.  Musculoskeletal: Negative for arthralgias and back pain.  Skin: Negative for color change and rash.  Neurological: Positive for numbness. Negative for seizures and syncope.  All other systems reviewed and are negative.    Physical Exam Triage Vital Signs ED Triage Vitals  Enc Vitals Group     BP 04/20/18 1109 128/85     Pulse Rate 04/20/18 1109 83     Resp 04/20/18 1109 16     Temp 04/20/18 1109 98.3 F (36.8 C)     Temp Source 04/20/18 1109 Oral     SpO2 04/20/18 1109 100 %     Weight --      Height --      Head Circumference --      Peak Flow --      Pain Score 04/20/18 1110 4     Pain Loc --      Pain Edu? --      Excl. in [place]? --    No data found.  Updated Vital Signs BP 128/85 (BP Location: Left Arm)   Pulse 83   Temp 98.3 F (36.8 C) (Oral)   Resp 16   SpO2 100%   Visual Acuity Right Eye Distance:   Left Eye Distance:   Bilateral Distance:    Right Eye Near:   Left Eye Near:    Bilateral Near:     Physical Exam  Constitutional: She appears well-developed and well-nourished. She appears distressed.  Appears uncomfortable  HENT:  Head: Normocephalic and atraumatic.  Right Ear: External ear normal.  Left Ear: External ear normal.  Mouth/Throat: Oropharynx is clear and moist.  Eyes: Pupils are equal, round, and reactive to light. Conjunctivae are normal.  Neck: Normal range of motion. Neck supple.  Cardiovascular: Normal rate, regular rhythm and normal heart sounds.  Pulmonary/Chest: Effort normal and breath sounds normal. No respiratory distress.  Abdominal: Soft. Bowel sounds are normal. She exhibits no distension.  No HSM  Musculoskeletal: Normal range of motion. She exhibits no edema.  Lymphadenopathy:    She has no cervical adenopathy.  Neurological: She is alert. She displays normal reflexes. No cranial nerve deficit or sensory deficit. She exhibits normal muscle tone. Coordination normal.   Patient can feel light touch throughout fingers and toes  Skin: Skin is warm and dry.  Psychiatric: She has a normal mood and affect. Her behavior is normal.     UC Treatments / Results  Labs (all labs ordered are listed, but only abnormal results are displayed) Labs Reviewed  CBC WITH DIFFERENTIAL/PLATELET  COMPREHENSIVE METABOLIC PANEL  TSH  T3, FREE  T4, FREE  ANTINUCLEAR ANTIBODIES, IFA  HEMOGLOBIN A1C  VITAMIN B12    EKG None  Radiology No results  found.  Procedures Procedures (including critical care time)  Medications Ordered in UC Medications - No data to display  Initial Impression / Assessment and Plan / UC Course  I have reviewed the triage vital signs and the nursing notes.  Pertinent labs & imaging results that were available during my care of the patient were reviewed by me and considered in my medical decision making (see chart for details).     We discussed that this is an unusual presentation for diabetic neuropathy.  She is only been diabetic for couple years.  She has a history of well-controlled diabetes.  Had a sudden onset.  None of these are typical.  There is no family history of polyneuropathies.  Never had a history of vitamin deficiencies.  She thinks her thyroid is well controlled but is overdue for blood work.  I told her that it at the urgent care center there is a limited amount of evaluation that I can do.  I will order some initial blood work, give her a trial of gabapentin, and then she must follow-up with her endocrinologist.  If the numbness persists she needs to see a neurologist. Final Clinical Impressions(s) / UC Diagnoses   Final diagnoses:  Tingling in extremities  Pain in extremity, unspecified extremity     Discharge Instructions     I have ordered blood test to figure out why you have nerve symptoms These lab tests will come back in 2 to 3 days.  We will call you if anything is abnormal. You can come to the office and  pick up a copy of your labs, once the results are in Continue to eat well and get moderate exercise, as tolerated Take gabapentin for the nerve pain.  Start with 1 pill a day.  It may cause drowsiness.  You may advance to 2 pills a day, then 3 pills a day if needed. You need to follow-up with an endocrinologist as soon as it can be arranged.  I will give you a number.   ED Prescriptions    Medication Sig Dispense Auth. Provider   gabapentin (NEURONTIN) 300 MG capsule Take 1 capsule (300 mg total) by mouth 3 (three) times daily. Start with one a day, advance as directed 90 capsule Eustace Moore, MD     Controlled Substance Prescriptions [place] Controlled Substance Registry consulted? Not Applicable   Eustace Moore, MD 04/20/18 1309

## 2018-04-20 NOTE — Discharge Instructions (Addendum)
I have ordered blood test to figure out why you have nerve symptoms These lab tests will come back in 2 to 3 days.  We will call you if anything is abnormal. You can come to the office and pick up a copy of your labs, once the results are in Continue to eat well and get moderate exercise, as tolerated Take gabapentin for the nerve pain.  Start with 1 pill a day.  It may cause drowsiness.  You may advance to 2 pills a day, then 3 pills a day if needed. You need to follow-up with an endocrinologist as soon as it can be arranged.  I will give you a number.

## 2018-04-21 LAB — T3, FREE: T3, Free: 3.2 pg/mL (ref 2.0–4.4)

## 2018-04-21 LAB — ANTINUCLEAR ANTIBODIES, IFA: ANTINUCLEAR ANTIBODIES, IFA: NEGATIVE

## 2018-05-07 DIAGNOSIS — Z124 Encounter for screening for malignant neoplasm of cervix: Secondary | ICD-10-CM | POA: Diagnosis not present

## 2018-07-01 DIAGNOSIS — J1189 Influenza due to unidentified influenza virus with other manifestations: Secondary | ICD-10-CM | POA: Diagnosis not present

## 2018-07-15 DIAGNOSIS — E0500 Thyrotoxicosis with diffuse goiter without thyrotoxic crisis or storm: Secondary | ICD-10-CM | POA: Diagnosis not present

## 2018-07-15 DIAGNOSIS — E1065 Type 1 diabetes mellitus with hyperglycemia: Secondary | ICD-10-CM | POA: Diagnosis not present

## 2018-07-21 DIAGNOSIS — E1065 Type 1 diabetes mellitus with hyperglycemia: Secondary | ICD-10-CM | POA: Diagnosis not present

## 2018-07-21 DIAGNOSIS — E0500 Thyrotoxicosis with diffuse goiter without thyrotoxic crisis or storm: Secondary | ICD-10-CM | POA: Diagnosis not present

## 2018-11-08 DIAGNOSIS — F411 Generalized anxiety disorder: Secondary | ICD-10-CM | POA: Diagnosis not present

## 2018-11-16 ENCOUNTER — Other Ambulatory Visit: Payer: Self-pay

## 2018-11-16 ENCOUNTER — Ambulatory Visit (HOSPITAL_COMMUNITY)
Admission: EM | Admit: 2018-11-16 | Discharge: 2018-11-16 | Disposition: A | Discharge status: Home / Self Care | Payer: 59 | Attending: Family Medicine | Admitting: Family Medicine

## 2018-11-16 ENCOUNTER — Encounter (HOSPITAL_COMMUNITY): Payer: Self-pay | Admitting: Family Medicine

## 2018-11-16 DIAGNOSIS — R21 Rash and other nonspecific skin eruption: Secondary | ICD-10-CM | POA: Insufficient documentation

## 2018-11-16 LAB — POCT RAPID STREP A: Streptococcus, Group A Screen (Direct): NEGATIVE

## 2018-11-16 NOTE — ED Triage Notes (Signed)
Patient has generalized rash to torso and extremities.  No itching.  Patient noticed this about 4:30 pm today.  Patient did take benadryl 50mg  po this evening after noticing rash

## 2018-11-16 NOTE — ED Provider Notes (Signed)
[facility]    CSN: [id number] Arrival date & time: 11/16/18  1759     History   Chief Complaint Chief Complaint  Patient presents with  . Rash    HPI Angela Meyer is a 23 y.o. female.   23 yo woman, established patient at [facility], presents with rash.  She noticed the rash at work today.  She works with clients at [employer] No interdigital rash.  The mild itching has been alleviated by Benadryl.  Rash is on torso and arms at this point.  She's had a scratchy throat today as well.  PMHx significant for Type 1 diabetes (listed twice on problem list), thyroiditis-autoimmune and hyperthyroid (listed several times in past medical history section)  Last labs 04/10/2018:      Hgb A1C:  9.7    TSH  .832    Random BS - 270     Past Medical History:  Diagnosis Date  . Chronic autoimmune thyroiditis   . Diabetes mellitus   . Dyspepsia   . Fetal tachycardia before the onset of labor   . Goiter with hyperthyroidism   . Irritability   . Irritability   . Tachycardia   . Thyroiditis, autoimmune   . Thyrotoxicosis with diffuse goiter   . Tremor   . Tremor   . Weight loss   . Weight loss, abnormal     Patient Active Problem List   Diagnosis Date Noted  . Pyelonephritis 01/30/2014  . Bacteremia due to Gram-negative bacteria 01/30/2014  . UTI (urinary tract infection), bacterial 01/29/2014  . DKA, type 1 ([place]) 01/29/2014  . Diabetes type 1, uncontrolled ([place]) 11/26/2010  . Dehydration 11/26/2010  . 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 Thyroiditis, autoimmune   . Hyperthyroidism 10/14/2010    History reviewed. No pertinent surgical history.  OB History   No obstetric history on file.      Home Medications    Prior to Admission medications   Medication Sig Start Date End Date Taking? Authorizing Provider  Insulin Aspart (NOVOLOG FLEXPEN [place]) Inject 0-15 Units into the skin 4 (four) times daily as needed (blood sugar).     [provider]  insulin  glargine (LANTUS) 100 UNIT/ML injection Inject 0.18 mLs (18 Units total) into the skin daily. 02/03/14   Dellinger, Bobby Rumpf, PA-C  methimazole (TAPAZOLE) 5 MG tablet Take 15 mg by mouth 2 (two) times daily.    [provider]    Family History Family History  Problem Relation Age of Onset  . Diabetes Father   . Diabetes Paternal Aunt   . Thyroid disease Maternal Grandmother   . Heart disease Maternal Grandfather     Social History Social History   Tobacco Use  . Smoking status: Never Smoker  . Smokeless tobacco: Never Used  Substance Use Topics  . Alcohol use: No  . Drug use: No     Allergies   Patient has no known allergies.   Review of Systems Review of Systems  Constitutional: Negative.   Skin: Positive for rash.  All other systems reviewed and are negative.    Physical Exam Triage Vital Signs ED Triage Vitals  Enc Vitals Group     BP      Pulse      Resp      Temp      Temp src      SpO2      Weight      Height      Head Circumference  Peak Flow      Pain Score      Pain Loc      Pain Edu?      Excl. in [place]?    No data found.  Updated Vital Signs BP 120/83 (BP Location: Left Arm)   Pulse 81   Temp 99.2 F (37.3 C) (Oral)   Resp 18   LMP 10/17/2018   SpO2 99%    Physical Exam Vitals signs and nursing note reviewed.  Constitutional:      Appearance: Normal appearance. She is normal weight.  HENT:     Right Ear: External ear normal.     Left Ear: External ear normal.     Nose: Nose normal.     Mouth/Throat:     Pharynx: Oropharynx is clear. Posterior oropharyngeal erythema present.  Eyes:     Conjunctiva/sclera: Conjunctivae normal.  Neck:     Musculoskeletal: Normal range of motion and neck supple.  Cardiovascular:     Rate and Rhythm: Normal rate.  Pulmonary:     Effort: Pulmonary effort is normal.  Musculoskeletal: Normal range of motion.  Skin:    General: Skin is warm and dry.     Findings: Erythema present.      Comments: Fine mac-papular rash on forearms and lower abdomen with some dermatographia on right forearm.  Neurological:     General: No focal deficit present.     Mental Status: She is alert.  Psychiatric:        Mood and Affect: Mood normal.        Thought Content: Thought content normal.      UC Treatments / Results  Labs (all labs ordered are listed, but only abnormal results are displayed) Labs Reviewed  CULTURE, GROUP A STREP [facility])  POCT RAPID STREP A    EKG None  Radiology No results found.  Procedures Procedures (including critical care time)  Medications Ordered in UC Medications - No data to display  Initial Impression / Assessment and Plan / UC Course  I have reviewed the triage vital signs and the nursing notes.  Pertinent labs & imaging results that were available during my care of the patient were reviewed by me and considered in my medical decision making (see chart for details).    Final Clinical Impressions(s) / UC Diagnoses   Final diagnoses:  Rash and nonspecific skin eruption     Discharge Instructions     The rapid strep test was negative and we will run a confirmatory test over the next two days.  We will call you if that test is positive.  At this point, the rash looks more allergic than infectious or contagious.  Benadryl is the best treatment.  I would expect the rash to gradually disappear over the next 2-3 days.  We don't have blood or urine tests to tell us what started this rash.  If you are worsening or not improving over 2-3 days, please return for a recheck.    ED Prescriptions    None     Controlled Substance Prescriptions [place] Controlled Substance Registry consulted? reviewed   Robyn Haber, MD 11/16/18 [phone]

## 2018-11-16 NOTE — Discharge Instructions (Signed)
The rapid strep test was negative and we will run a confirmatory test over the next two days.  We will call you if that test is positive.  At this point, the rash looks more allergic than infectious or contagious.  Benadryl is the best treatment.  I would expect the rash to gradually disappear over the next 2-3 days.  We don't have blood or urine tests to tell us what started this rash.  If you are worsening or not improving over 2-3 days, please return for a recheck.

## 2018-11-18 DIAGNOSIS — F411 Generalized anxiety disorder: Secondary | ICD-10-CM | POA: Diagnosis not present

## 2018-11-19 LAB — CULTURE, GROUP A STREP (THRC)

## 2018-11-22 DIAGNOSIS — F411 Generalized anxiety disorder: Secondary | ICD-10-CM | POA: Diagnosis not present

## 2018-11-24 DIAGNOSIS — E1065 Type 1 diabetes mellitus with hyperglycemia: Secondary | ICD-10-CM | POA: Diagnosis not present

## 2018-11-24 DIAGNOSIS — E0500 Thyrotoxicosis with diffuse goiter without thyrotoxic crisis or storm: Secondary | ICD-10-CM | POA: Diagnosis not present

## 2018-12-01 DIAGNOSIS — F411 Generalized anxiety disorder: Secondary | ICD-10-CM | POA: Diagnosis not present

## 2018-12-08 DIAGNOSIS — F411 Generalized anxiety disorder: Secondary | ICD-10-CM | POA: Diagnosis not present

## 2018-12-09 DIAGNOSIS — E0500 Thyrotoxicosis with diffuse goiter without thyrotoxic crisis or storm: Secondary | ICD-10-CM | POA: Diagnosis not present

## 2018-12-23 DIAGNOSIS — F411 Generalized anxiety disorder: Secondary | ICD-10-CM | POA: Diagnosis not present

## 2018-12-30 DIAGNOSIS — F411 Generalized anxiety disorder: Secondary | ICD-10-CM | POA: Diagnosis not present

## 2019-01-13 DIAGNOSIS — F411 Generalized anxiety disorder: Secondary | ICD-10-CM | POA: Diagnosis not present

## 2019-01-19 DIAGNOSIS — E1065 Type 1 diabetes mellitus with hyperglycemia: Secondary | ICD-10-CM | POA: Diagnosis not present

## 2019-01-19 DIAGNOSIS — F321 Major depressive disorder, single episode, moderate: Secondary | ICD-10-CM | POA: Diagnosis not present

## 2019-01-19 DIAGNOSIS — E0500 Thyrotoxicosis with diffuse goiter without thyrotoxic crisis or storm: Secondary | ICD-10-CM | POA: Diagnosis not present

## 2019-01-19 DIAGNOSIS — F411 Generalized anxiety disorder: Secondary | ICD-10-CM | POA: Diagnosis not present

## 2019-01-20 DIAGNOSIS — F411 Generalized anxiety disorder: Secondary | ICD-10-CM | POA: Diagnosis not present

## 2019-03-18 ENCOUNTER — Other Ambulatory Visit: Payer: Self-pay

## 2019-03-18 ENCOUNTER — Ambulatory Visit
Admission: EM | Admit: 2019-03-18 | Discharge: 2019-03-18 | Disposition: A | Discharge status: Home / Self Care | Payer: 59 | Attending: Emergency Medicine | Admitting: Emergency Medicine

## 2019-03-18 DIAGNOSIS — N898 Other specified noninflammatory disorders of vagina: Secondary | ICD-10-CM

## 2019-03-18 DIAGNOSIS — R81 Glycosuria: Secondary | ICD-10-CM

## 2019-03-18 DIAGNOSIS — E860 Dehydration: Secondary | ICD-10-CM | POA: Diagnosis not present

## 2019-03-18 LAB — POCT URINALYSIS DIP (MANUAL ENTRY)
Bilirubin, UA: NEGATIVE
Blood, UA: NEGATIVE
Glucose, UA: 500 mg/dL — AB
Ketones, POC UA: NEGATIVE mg/dL
Leukocytes, UA: NEGATIVE
Nitrite, UA: NEGATIVE
Protein Ur, POC: NEGATIVE mg/dL
Spec Grav, UA: >=1.03 — AB (ref 1.010–1.025)
Urobilinogen, UA: 0.2 E.U./dL
pH, UA: 6 (ref 5.0–8.0)

## 2019-03-18 LAB — POCT URINE PREGNANCY: Preg Test, Ur: NEGATIVE

## 2019-03-18 NOTE — Discharge Instructions (Signed)
For to drink plenty of water throughout the day. Monitor your sugar at home. We will call you with your test results if they are positive and send in antibiotics if needed. Return for worsening vaginal irritation, discharge, bleeding, nausea, vomiting, abdominal pain.

## 2019-03-18 NOTE — ED Triage Notes (Signed)
Pt presents to UC stating urine was cloudy earlier in week. Pt reports urine has an odor. Denies burning or abd pain. Denies abnormal discharge.

## 2019-03-18 NOTE — ED Notes (Signed)
Patient able to ambulate independently  

## 2019-03-18 NOTE — ED Provider Notes (Signed)
[name]    CSN: [id number] Arrival date & time: 03/18/19  1943      History   Chief Complaint No chief complaint on file.   HPI Angela Meyer is a [age] y.o. female history type 1 diabetes diabetes, tachycardia, autoimmune thyroiditis presenting for 1 week course of malodorous cloudy urine and vaginal irritation.  Patient has increased her water intake, thus had increased urinary frequency.  Patient denies vaginal discharge, anogenital lesions.  Patient has a new sexual partner: Requesting STD screening today.  LMP 9/12.  Patient denies signs/symptoms consistent with previous results of DKA, such as malaise, weakness, polydipsia, nausea, vomiting, abdominal pain, shortness of breath, fruity breath.   Past Medical History:  Diagnosis Date  . Chronic autoimmune thyroiditis   . Diabetes mellitus   . Dyspepsia   . Fetal tachycardia before the onset of labor   . Goiter with hyperthyroidism   . Irritability   . Irritability   . Tachycardia   . Thyroiditis, autoimmune   . Thyrotoxicosis with diffuse goiter   . Tremor   . Tremor   . Weight loss   . Weight loss, abnormal     Patient Active Problem List   Diagnosis Date Noted  . Pyelonephritis 01/30/2014  . Bacteremia due to Gram-negative bacteria 01/30/2014  . UTI (urinary tract infection), bacterial 01/29/2014  . DKA, type 1 (HCC) 01/29/2014  . Diabetes type 1, uncontrolled (HCC) 11/26/2010  . Dehydration 11/26/2010  . 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 Thyroiditis, autoimmune   . Hyperthyroidism 10/14/2010    History reviewed. No pertinent surgical history.  OB History   No obstetric history on file.      Home Medications    Prior to Admission medications   Medication Sig Start Date End Date Taking? Authorizing Provider  drospirenone-ethinyl estradiol (YAZ) 3-0.02 MG tablet Take 1 tablet by mouth daily.   Yes [provider]  Insulin Aspart (NOVOLOG FLEXPEN [place]) Inject 0-15 Units into the  skin 4 (four) times daily as needed (blood sugar).     [provider]  insulin glargine (LANTUS) 100 UNIT/ML injection Inject 0.18 mLs (18 Units total) into the skin daily. 02/03/14   Dellinger, Tora KindredMarianne L, PA-C  methimazole (TAPAZOLE) 5 MG tablet Take 15 mg by mouth 2 (two) times daily.    [provider]    Family History Family History  Problem Relation Age of Onset  . Diabetes Father   . Diabetes Paternal Aunt   . Thyroid disease Maternal Grandmother   . Heart disease Maternal Grandfather     Social History Social History   Tobacco Use  . Smoking status: Never Smoker  . Smokeless tobacco: Never Used  Substance Use Topics  . Alcohol use: Yes    Comment: occasionally  . Drug use: No     Allergies   Patient has no known allergies.   Review of Systems Review of Systems  Constitutional: Negative for fatigue and fever.  HENT: Negative for ear pain, sinus pain, sore throat and voice change.   Eyes: Negative for pain, redness and visual disturbance.  Respiratory: Negative for cough and shortness of breath.   Cardiovascular: Negative for chest pain and palpitations.  Gastrointestinal: Negative for abdominal pain, diarrhea and vomiting.  Genitourinary: Positive for frequency. Negative for dysuria, urgency, vaginal bleeding, vaginal discharge and vaginal pain.  Musculoskeletal: Negative for arthralgias and myalgias.  Skin: Negative for rash and wound.  Neurological: Negative for syncope and headaches.     Physical  Exam Triage Vital Signs ED Triage Vitals  Enc Vitals Group     BP      Pulse      Resp      Temp      Temp src      SpO2      Weight      Height      Head Circumference      Peak Flow      Pain Score      Pain Loc      Pain Edu?      Excl. in GC?    No data found.  Updated Vital Signs BP 130/84 (BP Location: Left Arm)   Pulse 88   Temp 99.1 F (37.3 C) (Oral)   Resp 16   LMP 03/05/2019   SpO2 98%   Visual Acuity Right  Eye Distance:   Left Eye Distance:   Bilateral Distance:    Right Eye Near:   Left Eye Near:    Bilateral Near:     Physical Exam Constitutional:      General: She is not in acute distress. HENT:     Head: Normocephalic and atraumatic.  Eyes:     General: No scleral icterus.    Pupils: Pupils are equal, round, and reactive to light.  Cardiovascular:     Rate and Rhythm: Normal rate.  Pulmonary:     Effort: Pulmonary effort is normal.  Abdominal:     General: Bowel sounds are normal.     Palpations: Abdomen is soft.     Tenderness: There is no abdominal tenderness. There is no right CVA tenderness, left CVA tenderness or guarding.  Genitourinary:    Comments: Patient declined, self-swab performed Skin:    Coloration: Skin is not jaundiced or pale.  Neurological:     Mental Status: She is alert and oriented to person, place, and time.      UC Treatments / Results  Labs (all labs ordered are listed, but only abnormal results are displayed) Labs Reviewed  POCT URINALYSIS DIP (MANUAL ENTRY) - Abnormal; Notable for the following components:      Result Value   Glucose, UA =500 (*)    Spec Grav, UA >=1.030 (*)    All other components within normal limits  POCT URINE PREGNANCY - Normal  CERVICOVAGINAL ANCILLARY ONLY    EKG   Radiology No results found.  Procedures Procedures (including critical care time)  Medications Ordered in UC Medications - No data to display  Initial Impression / Assessment and Plan / UC Course  I have reviewed the triage vital signs and the nursing notes.  Pertinent labs & imaging results that were available during my care of the patient were reviewed by me and considered in my medical decision making (see chart for details).     1.  Vaginal irritation POCT urine dipstick done in office, reviewed by me: Positive for glucosuria, mild dehydration.  Urine pregnancy: Negative.  Patient denying signs/symptoms consistent with DKA: We will  focus on oral hydration with water only.  Will defer to cervical vaginal swab as patient does not have difficult vaginal discharge, pruritus.  Return precautions discussed, patient verbalized understanding and is agreeable to plan. Final Clinical Impressions(s) / UC Diagnoses   Final diagnoses:  Vaginal irritation     Discharge Instructions     For to drink plenty of water throughout the day. Monitor your sugar at home. We will call you with your test results if  they are positive and send in antibiotics if needed. Return for worsening vaginal irritation, discharge, bleeding, nausea, vomiting, abdominal pain.    ED Prescriptions    None     PDMP not reviewed this encounter.   Hall-Potvin, [place], [place] 03/18/19 2013

## 2019-03-22 ENCOUNTER — Telehealth: Payer: Self-pay | Admitting: Emergency Medicine

## 2019-03-22 LAB — CERVICOVAGINAL ANCILLARY ONLY
Bacterial Vaginitis (gardnerella): POSITIVE — AB
Candida Glabrata: NEGATIVE
Candida Vaginitis: NEGATIVE
Chlamydia: NEGATIVE
Molecular Disclaimer: NEGATIVE
Molecular Disclaimer: NEGATIVE
Molecular Disclaimer: NEGATIVE
Molecular Disclaimer: NEGATIVE
Molecular Disclaimer: NORMAL
Molecular Disclaimer: NORMAL
Neisseria Gonorrhea: NEGATIVE
Trichomonas: NEGATIVE

## 2019-03-22 MED ORDER — METRONIDAZOLE 500 MG PO TABS: 500.0000 mg | ORAL_TABLET | Freq: Two times a day (BID) | ORAL | 0 refills | Status: DC

## 2019-03-22 NOTE — Telephone Encounter (Signed)
Patient called for test results, provided positive BV result.  Patient still having symptoms, Amy, APP on site today, verbal order for 500mg  Flagyl twice daily x 7 days.  Sent to [facility], confirmed pharmacy with patient prior to submission.  Patient verbalized understanding of medication, rationale, and the need to refrain from all alcohol use until done with antibiotic.

## 2019-03-23 ENCOUNTER — Telehealth: Payer: Self-pay | Admitting: Emergency Medicine

## 2019-03-23 NOTE — Telephone Encounter (Signed)
Patient called to ask about side effects of Flagyl, concerned because she took a dose last night and her sugars are 300s today.  States she treated with home insulin, but was concerned that she shouldn't continue taking it.  Reviewed side effects with APP on site today, and sugar concerns are not listed.  Reassured patient and recommended she follow up with her PCP via MyChart or phone call to office.  Patient verbalized understanding.

## 2019-04-17 ENCOUNTER — Ambulatory Visit
Admission: EM | Admit: 2019-04-17 | Discharge: 2019-04-17 | Disposition: A | Discharge status: Home / Self Care | Payer: 59 | Attending: Emergency Medicine | Admitting: Emergency Medicine

## 2019-04-17 ENCOUNTER — Other Ambulatory Visit: Payer: Self-pay

## 2019-04-17 DIAGNOSIS — N898 Other specified noninflammatory disorders of vagina: Secondary | ICD-10-CM

## 2019-04-17 NOTE — ED Notes (Signed)
Patient able to ambulate independently  

## 2019-04-17 NOTE — Discharge Instructions (Addendum)
We will call you with the results of your swab, and treat if needed. Avoid using smelly soaps, perfumes, dyes, detergents. Do not douche/wash inside your vagina. Return for worsening discharge, irritation, rash, pelvic/vaginal pain, abdominal pain.

## 2019-04-17 NOTE — ED Triage Notes (Addendum)
Pt. States she was her 03/18/19 for the same vaginal itching, they informed her of having BV. She has had relief but is back because she is having the same vaginal itching, discharge since thursday.

## 2019-04-17 NOTE — ED Notes (Signed)
MAde patient aware of possible delay in rooming, and offered water.  Verbalized understanding and declined.

## 2019-04-17 NOTE — ED Provider Notes (Signed)
[facility]    CSN: [id number] Arrival date & time: 04/17/19  1040      History   Chief Complaint Chief Complaint  Patient presents with  . Vaginal Itching    HPI Angela Meyer is a 23 y.o. female presenting for mild vaginal irritation since Thursday.  Nursing note reviewed by me at time of appointment: Patient denying vaginal itching, discharge.  Patient was seen by me on 9/25 for similar complaint (please review note) for which BV.  Patient states that she completed course which was prescribed on 03/24/2019 without adverse effect.  Patient denies any urinary symptoms, concern for STD at this time.  Patient appears to have difficulty articulating sensation.  States that she was able to "take a mirror and look" and did not notice any rash, discharge.  Maintains that she is performing appropriate vaginal hygiene.   Past Medical History:  Diagnosis Date  . Chronic autoimmune thyroiditis   . Diabetes mellitus   . Dyspepsia   . Fetal tachycardia before the onset of labor   . Goiter with hyperthyroidism   . Irritability   . Irritability   . Tachycardia   . Thyroiditis, autoimmune   . Thyrotoxicosis with diffuse goiter   . Tremor   . Tremor   . Weight loss   . Weight loss, abnormal     Patient Active Problem List   Diagnosis Date Noted  . Pyelonephritis 01/30/2014  . Bacteremia due to Gram-negative bacteria 01/30/2014  . UTI (urinary tract infection), bacterial 01/29/2014  . DKA, type 1 ([place]) 01/29/2014  . Diabetes type 1, uncontrolled ([place]) 11/26/2010  . Dehydration 11/26/2010  . 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 Thyroiditis, autoimmune   . Hyperthyroidism 10/14/2010    History reviewed. No pertinent surgical history.  OB History   No obstetric history on file.      Home Medications    Prior to Admission medications   Medication Sig Start Date End Date Taking? Authorizing Provider  drospirenone-ethinyl estradiol (YAZ) 3-0.02 MG tablet Take 1  tablet by mouth daily.    [provider]  Insulin Aspart (NOVOLOG FLEXPEN [place]) Inject 0-15 Units into the skin 4 (four) times daily as needed (blood sugar).     [provider]  insulin glargine (LANTUS) 100 UNIT/ML injection Inject 0.18 mLs (18 Units total) into the skin daily. 02/03/14   Dellinger, Bobby Rumpf, PA-C  methimazole (TAPAZOLE) 5 MG tablet Take 15 mg by mouth 2 (two) times daily.    [provider]  metroNIDAZOLE (FLAGYL) 500 MG tablet Take 1 tablet (500 mg total) by mouth 2 (two) times daily. 03/22/19   Ok Edwards, PA-C    Family History Family History  Problem Relation Age of Onset  . Diabetes Father   . Diabetes Paternal Aunt   . Thyroid disease Maternal Grandmother   . Heart disease Maternal Grandfather     Social History Social History   Tobacco Use  . Smoking status: Never Smoker  . Smokeless tobacco: Never Used  Substance Use Topics  . Alcohol use: Yes    Comment: occasionally  . Drug use: No     Allergies   Patient has no known allergies.   Review of Systems Review of Systems  Constitutional: Negative for fatigue and fever.  Respiratory: Negative for cough and shortness of breath.   Cardiovascular: Negative for chest pain and palpitations.  Gastrointestinal: Negative for constipation and diarrhea.  Genitourinary: Negative for dysuria, flank pain, frequency, hematuria, pelvic pain,  urgency, vaginal bleeding, vaginal discharge and vaginal pain.     Physical Exam Triage Vital Signs ED Triage Vitals [04/17/19 1103]  Enc Vitals Group     BP      Pulse      Resp      Temp      Temp src      SpO2      Weight 117 lb 15.1 oz (53.5 kg)     Height      Head Circumference      Peak Flow      Pain Score 8     Pain Loc      Pain Edu?      Excl. in GC?    No data found.  Updated Vital Signs BP 119/81 (BP Location: Left Arm)   Pulse 80   Temp 99.8 F (37.7 C) (Tympanic)   Wt 117 lb 15.1 oz (53.5 kg)   LMP 04/01/2019    SpO2 95%   BMI 21.57 kg/m   Visual Acuity Right Eye Distance:   Left Eye Distance:   Bilateral Distance:    Right Eye Near:   Left Eye Near:    Bilateral Near:     Physical Exam Constitutional:      General: She is not in acute distress. HENT:     Head: Normocephalic and atraumatic.  Eyes:     General: No scleral icterus.    Pupils: Pupils are equal, round, and reactive to light.  Cardiovascular:     Rate and Rhythm: Normal rate.  Pulmonary:     Effort: Pulmonary effort is normal.  Abdominal:     General: Bowel sounds are normal.     Palpations: Abdomen is soft.     Tenderness: There is no abdominal tenderness. There is no right CVA tenderness, left CVA tenderness or guarding.  Genitourinary:    Comments: Patient declined, self-swab performed Skin:    Coloration: Skin is not jaundiced or pale.  Neurological:     Mental Status: She is alert and oriented to person, place, and time.      UC Treatments / Results  Labs (all labs ordered are listed, but only abnormal results are displayed) Labs Reviewed  CERVICOVAGINAL ANCILLARY ONLY    EKG   Radiology No results found.  Procedures Procedures (including critical care time)  Medications Ordered in UC Medications - No data to display  Initial Impression / Assessment and Plan / UC Course  I have reviewed the triage vital signs and the nursing notes.  Pertinent labs & imaging results that were available during my care of the patient were reviewed by me and considered in my medical decision making (see chart for details).     Patient appears well, declines genital/bimanual exam today.  Patient requesting to do swab to see if she still has BV.  Discussed appropriate vaginal hygiene as outlined below as well as BV been an overgrowth of normal vaginal flora rather than an infectious process.  Patient states that she would prefer to wait for swab prior to initiating treatment.  Recommended that for persistent,  worsening symptoms she follow-up with her PCP/OB/GYN.  Return precautions discussed, patient verbalized understanding and is agreeable to plan. Final Clinical Impressions(s) / UC Diagnoses   Final diagnoses:  Vaginal irritation     Discharge Instructions     We will call you with the results of your swab, and treat if needed. Avoid using smelly soaps, perfumes, dyes, detergents. Do not douche/wash inside  your vagina. Return for worsening discharge, irritation, rash, pelvic/vaginal pain, abdominal pain.    ED Prescriptions    None     PDMP not reviewed this encounter.   Odette Fraction [place], [place] 04/17/19 1836

## 2019-04-21 LAB — CERVICOVAGINAL ANCILLARY ONLY
Bacterial vaginitis: NEGATIVE
Candida vaginitis: POSITIVE — AB

## 2019-04-22 ENCOUNTER — Telehealth (HOSPITAL_COMMUNITY): Payer: Self-pay | Admitting: Emergency Medicine

## 2019-04-22 MED ORDER — FLUCONAZOLE 150 MG PO TABS: 150.0000 mg | ORAL_TABLET | Freq: Once | ORAL | 0 refills | Status: AC

## 2019-04-22 NOTE — Telephone Encounter (Signed)
Pt returned my call, informed her of her test results and medication sent to pharmacy. Pt verbalized understanding.

## 2019-04-22 NOTE — Telephone Encounter (Signed)
Test for candida (yeast) was positive.  Prescription for fluconazole 150mg po now, repeat dose in 3d if needed, #2 no refills, sent to the pharmacy of record.  Recheck or followup with PCP for further evaluation if symptoms are not improving.    Attempted to reach patient. No answer at this time. Voicemail left.     

## 2019-06-20 ENCOUNTER — Encounter: Payer: Self-pay | Admitting: Emergency Medicine

## 2019-06-20 ENCOUNTER — Ambulatory Visit
Admission: EM | Admit: 2019-06-20 | Discharge: 2019-06-20 | Disposition: A | Discharge status: Home / Self Care | Payer: 59 | Attending: Emergency Medicine | Admitting: Emergency Medicine

## 2019-06-20 ENCOUNTER — Other Ambulatory Visit: Payer: Self-pay

## 2019-06-20 DIAGNOSIS — R0981 Nasal congestion: Secondary | ICD-10-CM | POA: Diagnosis not present

## 2019-06-20 DIAGNOSIS — Z20822 Contact with and (suspected) exposure to covid-19: Secondary | ICD-10-CM

## 2019-06-20 DIAGNOSIS — Z20828 Contact with and (suspected) exposure to other viral communicable diseases: Secondary | ICD-10-CM

## 2019-06-20 MED ORDER — FLUTICASONE PROPIONATE 50 MCG/ACT NA SUSP: 1.0000 | Freq: Every day | NASAL | 0 refills | Status: DC

## 2019-06-20 MED ORDER — LORATADINE 10 MG PO TABS: 10.0000 mg | ORAL_TABLET | Freq: Every day | ORAL | 0 refills | Status: DC

## 2019-06-20 NOTE — ED Triage Notes (Signed)
Pt presents to [facility] for assessment after finding out she was exposed to someone positive for COVID on christmas.  States she is now having nasal congestion and loss of taste and smell x 1 day.  Denies fevers.

## 2019-06-20 NOTE — Discharge Instructions (Signed)
Your COVID test is pending - it is important to quarantine / isolate at home until your results are back. °If you test positive and would like further evaluation for persistent or worsening symptoms, you may schedule an E-visit or virtual (video) visit throughout the [facility] MyChart app or website. ° °PLEASE NOTE: If you develop severe chest pain or shortness of breath please go to the ER or call 9-1-1 for further evaluation --> DO NOT schedule electronic or virtual visits for this. °Please call our office for further guidance / recommendations as needed. °

## 2019-06-20 NOTE — ED Provider Notes (Signed)
[name]    CSN: [id number] Arrival date & time: 06/20/19  3557      History   Chief Complaint Chief Complaint  Patient presents with  . Exposure to COVID    HPI Angela Meyer is a 23 y.o. female   Presenting for Covid testing: Exposure: Aunt Date of exposure: Christmas Any fever, symptoms since exposure: Yes-nasal congestion, loss of taste and smell since yesterday.  Not currently take anything for symptoms.  Denies fever, cough, shortness of breath.   Past Medical History:  Diagnosis Date  . Chronic autoimmune thyroiditis   . Diabetes mellitus   . Dyspepsia   . Fetal tachycardia before the onset of labor   . Goiter with hyperthyroidism   . Irritability   . Irritability   . Tachycardia   . Thyroiditis, autoimmune   . Thyrotoxicosis with diffuse goiter   . Tremor   . Tremor   . Weight loss   . Weight loss, abnormal     Patient Active Problem List   Diagnosis Date Noted  . Pyelonephritis 01/30/2014  . Bacteremia due to Gram-negative bacteria 01/30/2014  . UTI (urinary tract infection), bacterial 01/29/2014  . DKA, type 1 ([place]) 01/29/2014  . Diabetes type 1, uncontrolled ([place]) 11/26/2010  . Dehydration 11/26/2010  . 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 Thyroiditis, autoimmune   . Hyperthyroidism 10/14/2010    History reviewed. No pertinent surgical history.  OB History   No obstetric history on file.      Home Medications    Prior to Admission medications   Medication Sig Start Date End Date Taking? Authorizing Provider  drospirenone-ethinyl estradiol (YAZ) 3-0.02 MG tablet Take 1 tablet by mouth daily.    [provider]  fluticasone (FLONASE) 50 MCG/ACT nasal spray Place 1 spray into both nostrils daily. 06/20/19   Hall-Potvin, [place], PA-C  Insulin Aspart (NOVOLOG FLEXPEN [place]) Inject 0-15 Units into the skin 4 (four) times daily as needed (blood sugar).     [provider]  insulin glargine (LANTUS) 100 UNIT/ML  injection Inject 0.18 mLs (18 Units total) into the skin daily. 02/03/14   Dellinger, Bobby Rumpf, PA-C  loratadine (CLARITIN) 10 MG tablet Take 1 tablet (10 mg total) by mouth daily. 06/20/19   Hall-Potvin, [place], PA-C  methimazole (TAPAZOLE) 5 MG tablet Take 15 mg by mouth 2 (two) times daily.    [provider]  metroNIDAZOLE (FLAGYL) 500 MG tablet Take 1 tablet (500 mg total) by mouth 2 (two) times daily. 03/22/19   Ok Edwards, PA-C    Family History Family History  Problem Relation Age of Onset  . Diabetes Father   . Diabetes Paternal Aunt   . Thyroid disease Maternal Grandmother   . Heart disease Maternal Grandfather     Social History Social History   Tobacco Use  . Smoking status: Never Smoker  . Smokeless tobacco: Never Used  Substance Use Topics  . Alcohol use: Yes    Comment: occasionally  . Drug use: No     Allergies   Patient has no known allergies.   Review of Systems Review of Systems  Constitutional: Negative for fatigue and fever.  HENT: Positive for congestion. Negative for dental problem, ear pain, facial swelling, hearing loss, sinus pressure, sinus pain, sore throat, trouble swallowing and voice change.   Eyes: Negative for photophobia, pain and visual disturbance.  Respiratory: Negative for cough and shortness of breath.   Cardiovascular: Negative for chest pain and palpitations.  Gastrointestinal:  Negative for diarrhea and vomiting.  Musculoskeletal: Negative for arthralgias and myalgias.  Neurological: Negative for dizziness and headaches.     Physical Exam Triage Vital Signs ED Triage Vitals  Enc Vitals Group     BP 06/20/19 0944 110/77     Pulse Rate 06/20/19 0944 83     Resp 06/20/19 0944 18     Temp 06/20/19 0944 97.8 F (36.6 C)     Temp Source 06/20/19 0944 Temporal     SpO2 06/20/19 0944 97 %     Weight --      Height --      Head Circumference --      Peak Flow --      Pain Score 06/20/19 0945 0     Pain Loc --       Pain Edu? --      Excl. in GC? --    No data found.  Updated Vital Signs BP 110/77 (BP Location: Left Arm)   Pulse 83   Temp 97.8 F (36.6 C) (Temporal)   Resp 18   LMP 06/04/2019   SpO2 97%   Visual Acuity Right Eye Distance:   Left Eye Distance:   Bilateral Distance:    Right Eye Near:   Left Eye Near:    Bilateral Near:     Physical Exam Constitutional:      General: She is not in acute distress.    Appearance: She is normal weight. She is not toxic-appearing.  HENT:     Head: Normocephalic and atraumatic.     Nose:     Comments: Negative sinus tenderness bilaterally    Mouth/Throat:     Mouth: Mucous membranes are moist.     Pharynx: Oropharynx is clear.  Eyes:     General: No scleral icterus.    Pupils: Pupils are equal, round, and reactive to light.  Cardiovascular:     Rate and Rhythm: Normal rate.  Pulmonary:     Effort: Pulmonary effort is normal. No respiratory distress.     Breath sounds: No wheezing.  Musculoskeletal:     Cervical back: Neck supple. No tenderness.  Lymphadenopathy:     Cervical: No cervical adenopathy.  Skin:    Capillary Refill: Capillary refill takes less than 2 seconds.     Coloration: Skin is not jaundiced or pale.  Neurological:     Mental Status: She is alert and oriented to person, place, and time.      UC Treatments / Results  Labs (all labs ordered are listed, but only abnormal results are displayed) Labs Reviewed  NOVEL CORONAVIRUS, NAA    EKG   Radiology No results found.  Procedures Procedures (including critical care time)  Medications Ordered in UC Medications - No data to display  Initial Impression / Assessment and Plan / UC Course  I have reviewed the triage vital signs and the nursing notes.  Pertinent labs & imaging results that were available during my care of the patient were reviewed by me and considered in my medical decision making (see chart for details).     Patient afebrile,  nontoxic, with SpO2 97%.  Covid PCR pending.  Patient to quarantine until results are back.  We will start daytime antihistamine, intranasal steroid.  Return precautions discussed, patient verbalized understanding and is agreeable to plan. Final Clinical Impressions(s) / UC Diagnoses   Final diagnoses:  Nasal congestion  Exposure to COVID-19 virus     Discharge Instructions  Your COVID test is pending - it is important to quarantine / isolate at home until your results are back. If you test positive and would like further evaluation for persistent or worsening symptoms, you may schedule an E-visit or virtual (video) visit throughout the [facility] app or website.  PLEASE NOTE: If you develop severe chest pain or shortness of breath please go to the ER or call 9-1-1 for further evaluation --> DO NOT schedule electronic or virtual visits for this. Please call our office for further guidance / recommendations as needed.    ED Prescriptions    Medication Sig Dispense Auth. Provider   loratadine (CLARITIN) 10 MG tablet Take 1 tablet (10 mg total) by mouth daily. 30 tablet Hall-Potvin, [place], PA-C   fluticasone (FLONASE) 50 MCG/ACT nasal spray Place 1 spray into both nostrils daily. 16 g Hall-Potvin, [place], PA-C     PDMP not reviewed this encounter.   Hall-Potvin, [place], [place] 06/20/19 1001

## 2019-06-22 LAB — NOVEL CORONAVIRUS, NAA: SARS-CoV-2, NAA: NOT DETECTED

## 2019-12-23 ENCOUNTER — Encounter: Payer: Self-pay | Admitting: Emergency Medicine

## 2019-12-23 ENCOUNTER — Other Ambulatory Visit: Payer: Self-pay

## 2019-12-23 ENCOUNTER — Ambulatory Visit
Admission: EM | Admit: 2019-12-23 | Discharge: 2019-12-23 | Disposition: A | Discharge status: Home / Self Care | Payer: 59 | Attending: Emergency Medicine | Admitting: Emergency Medicine

## 2019-12-23 DIAGNOSIS — H60392 Other infective otitis externa, left ear: Secondary | ICD-10-CM | POA: Diagnosis not present

## 2019-12-23 MED ORDER — NEOMYCIN-POLYMYXIN-HC 3.5-10000-1 OT SOLN: 3.0000 [drp] | Freq: Three times a day (TID) | OTIC | 0 refills | Status: DC

## 2019-12-23 NOTE — Discharge Instructions (Signed)
Use eardrops as prescribed for the next week. Return for worsening ear pain, swelling, discharge, bleeding, decreased hearing, development of jaw pain/swelling, fever.  Do NOT use Q-tips as these can cause your ear wax to get stuck, the tips may break off and become a foreign body requiring additional medical care, or puncture your eardrum.  Helpful prevention tip: Use a solution of equal parts isopropyl (rubbing) alcohol and white vinegar (acetic acid) in both ears after swimming. 

## 2019-12-23 NOTE — ED Triage Notes (Signed)
Pt presents to [facility] for assessment of 3 weeks of left ear pain.  C/o some hearing loss and states she has been digging in it and had blood and a scab.

## 2019-12-23 NOTE — ED Provider Notes (Signed)
[name]    CSN: [id number] Arrival date & time: 12/23/19  1147      History   Chief Complaint Chief Complaint  Patient presents with  . Otalgia    HPI Angela Meyer is a 24 y.o. female with history of chronic autoimmune thyroiditis, type 1 diabetes presenting for 3-week course of left ear pain.  States that she noticed muffled hearing so she tried cleaning her ear out with her finger.  Does wear long, acrylic nails.  Noticed little bit of blood.  Denies trauma, tinnitus, hearing loss, fever, foreign body exposure sensation.  Has not tried nothing for this.  Recent change in medications or symptoms of poor glycemic control.   Past Medical History:  Diagnosis Date  . Chronic autoimmune thyroiditis   . Diabetes mellitus   . Dyspepsia   . Fetal tachycardia before the onset of labor   . Goiter with hyperthyroidism   . Irritability   . Irritability   . Tachycardia   . Thyroiditis, autoimmune   . Thyrotoxicosis with diffuse goiter   . Tremor   . Tremor   . Weight loss   . Weight loss, abnormal     Patient Active Problem List   Diagnosis Date Noted  . Pyelonephritis 01/30/2014  . Bacteremia due to Gram-negative bacteria 01/30/2014  . UTI (urinary tract infection), bacterial 01/29/2014  . DKA, type 1 (HCC) 01/29/2014  . Diabetes type 1, uncontrolled (HCC) 11/26/2010  . Dehydration 11/26/2010  . 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 Thyroiditis, autoimmune   . Hyperthyroidism 10/14/2010    History reviewed. No pertinent surgical history.  OB History   No obstetric history on file.      Home Medications    Prior to Admission medications   Medication Sig Start Date End Date Taking? Authorizing Provider  drospirenone-ethinyl estradiol (YAZ) 3-0.02 MG tablet Take 1 tablet by mouth daily.    [provider]  fluticasone (FLONASE) 50 MCG/ACT nasal spray Place 1 spray into both nostrils daily. 06/20/19   Hall-Potvin, [place], PA-C  Insulin Aspart  (NOVOLOG FLEXPEN [place]) Inject 0-15 Units into the skin 4 (four) times daily as needed (blood sugar).     [provider]  insulin glargine (LANTUS) 100 UNIT/ML injection Inject 0.18 mLs (18 Units total) into the skin daily. 02/03/14   Dellinger, Tora Kindred, PA-C  loratadine (CLARITIN) 10 MG tablet Take 1 tablet (10 mg total) by mouth daily. 06/20/19   Hall-Potvin, [place], PA-C  methimazole (TAPAZOLE) 5 MG tablet Take 15 mg by mouth 2 (two) times daily.    [provider]  metroNIDAZOLE (FLAGYL) 500 MG tablet Take 1 tablet (500 mg total) by mouth 2 (two) times daily. 03/22/19   Cathie Hoops, Amy V, PA-C  neomycin-polymyxin-hydrocortisone (CORTISPORIN) OTIC solution Place 3 drops into the left ear 3 (three) times daily. 12/23/19   Hall-Potvin, [place], PA-C    Family History Family History  Problem Relation Age of Onset  . Diabetes Father   . Diabetes Paternal Aunt   . Thyroid disease Maternal Grandmother   . Heart disease Maternal Grandfather     Social History Social History   Tobacco Use  . Smoking status: Never Smoker  . Smokeless tobacco: Never Used  Substance Use Topics  . Alcohol use: Yes    Comment: occasionally  . Drug use: No     Allergies   Patient has no known allergies.   Review of Systems As per HPI   Physical Exam Triage Vital Signs ED  Triage Vitals  Enc Vitals Group     BP      Pulse      Resp      Temp      Temp src      SpO2      Weight      Height      Head Circumference      Peak Flow      Pain Score      Pain Loc      Pain Edu?      Excl. in GC?    No data found.  Updated Vital Signs BP (!) 136/93 (BP Location: Left Arm)   Pulse 81   Temp 98 F (36.7 C) (Oral)   Resp 16   LMP 12/11/2019   SpO2 99%   Visual Acuity Right Eye Distance:   Left Eye Distance:   Bilateral Distance:    Right Eye Near:   Left Eye Near:    Bilateral Near:     Physical Exam Constitutional:      General: She is not in acute distress. HENT:      Head: Normocephalic and atraumatic.     Jaw: There is normal jaw occlusion. No tenderness or pain on movement.     Right Ear: Hearing, tympanic membrane, ear canal and external ear normal. No tenderness. No mastoid tenderness.     Left Ear: Hearing and tympanic membrane normal. No tenderness. No mastoid tenderness.     Ears:     Comments: Left ear with positive tragal tenderness, no swelling.  EAC mildly injected with scab.  No discharge    Nose: No nasal deformity, septal deviation or nasal tenderness.     Right Turbinates: Not swollen or pale.     Left Turbinates: Not swollen or pale.     Right Sinus: No maxillary sinus tenderness or frontal sinus tenderness.     Left Sinus: No maxillary sinus tenderness or frontal sinus tenderness.     Mouth/Throat:     Lips: Pink. No lesions.     Mouth: Mucous membranes are moist. No injury.     Pharynx: Oropharynx is clear. Uvula midline. No posterior oropharyngeal erythema or uvula swelling.     Comments: no tonsillar exudate or hypertrophy Cardiovascular:     Rate and Rhythm: Normal rate.  Pulmonary:     Effort: Pulmonary effort is normal.  Musculoskeletal:     Cervical back: Normal range of motion and neck supple. No muscular tenderness.  Lymphadenopathy:     Cervical: No cervical adenopathy.  Neurological:     Mental Status: She is alert and oriented to person, place, and time.      UC Treatments / Results  Labs (all labs ordered are listed, but only abnormal results are displayed) Labs Reviewed - No data to display  EKG   Radiology No results found.  Procedures Procedures (including critical care time)  Medications Ordered in UC Medications - No data to display  Initial Impression / Assessment and Plan / UC Course  I have reviewed the triage vital signs and the nursing notes.  Pertinent labs & imaging results that were available during my care of the patient were reviewed by me and considered in my medical decision making  (see chart for details).     Patient febrile, nontoxic in office today.  H&P concerning for acute otitis externa.  Will treat with Cortisporin as outlined below.  Return precautions discussed, patient verbalized understanding and is agreeable to  plan. Final Clinical Impressions(s) / UC Diagnoses   Final diagnoses:  Infective otitis externa of left ear     Discharge Instructions     Use eardrops as prescribed for the next week. Return for worsening ear pain, swelling, discharge, bleeding, decreased hearing, development of jaw pain/swelling, fever.  Do NOT use Q-tips as these can cause your ear wax to get stuck, the tips may break off and become a foreign body requiring additional medical care, or puncture your eardrum.  Helpful prevention tip: Use a solution of equal parts isopropyl (rubbing) alcohol and white vinegar (acetic acid) in both ears after swimming.    ED Prescriptions    Medication Sig Dispense Auth. Provider   neomycin-polymyxin-hydrocortisone (CORTISPORIN) OTIC solution Place 3 drops into the left ear 3 (three) times daily. 10 mL Hall-Potvin, [place], PA-C     PDMP not reviewed this encounter.   Hall-Potvin, [place], [place] 12/23/19 1238

## 2020-05-28 ENCOUNTER — Encounter: Payer: Self-pay | Admitting: *Deleted

## 2020-05-28 ENCOUNTER — Ambulatory Visit
Admission: EM | Admit: 2020-05-28 | Discharge: 2020-05-28 | Disposition: A | Discharge status: Home / Self Care | Payer: 59 | Attending: Emergency Medicine | Admitting: Emergency Medicine

## 2020-05-28 DIAGNOSIS — J069 Acute upper respiratory infection, unspecified: Secondary | ICD-10-CM | POA: Diagnosis not present

## 2020-05-28 DIAGNOSIS — Z1152 Encounter for screening for COVID-19: Secondary | ICD-10-CM | POA: Diagnosis not present

## 2020-05-28 MED ORDER — FLUTICASONE PROPIONATE 50 MCG/ACT NA SUSP: 1.0000 | Freq: Every day | NASAL | 0 refills | Status: DC

## 2020-05-28 MED ORDER — IBUPROFEN 800 MG PO TABS: 800.0000 mg | ORAL_TABLET | Freq: Three times a day (TID) | ORAL | 0 refills | Status: DC

## 2020-05-28 MED ORDER — BENZONATATE 200 MG PO CAPS: 200.0000 mg | ORAL_CAPSULE | Freq: Three times a day (TID) | ORAL | 0 refills | Status: AC | PRN

## 2020-05-28 NOTE — ED Provider Notes (Signed)
[name]    CSN: [id number] Arrival date & time: 05/28/20  0630      History   Chief Complaint Chief Complaint  Patient presents with  . Generalized Body Aches  . Cough  . Shortness of Breath  . Nasal Congestion    HPI Angela Meyer is a 24 y.o. female history of DM type I, presenting today for evaluation of URI symptoms.  Reports cough congestion body aches and fatigue.  Symptoms began Saturday, approximately 3 days ago.  Reports subjective fevers.  Has also reported shortness of breath.  Denies any history of asthma or lung problems.  Using over-the-counter medicine such as Mucinex Alka-Seltzer and ibuprofen.  Denies any known sick contacts.  HPI  Past Medical History:  Diagnosis Date  . Chronic autoimmune thyroiditis   . Diabetes mellitus   . Dyspepsia   . Fetal tachycardia before the onset of labor   . Goiter with hyperthyroidism   . Irritability   . Irritability   . Tachycardia   . Thyroiditis, autoimmune   . Thyrotoxicosis with diffuse goiter   . Tremor   . Tremor   . Weight loss   . Weight loss, abnormal     Patient Active Problem List   Diagnosis Date Noted  . Pyelonephritis 01/30/2014  . Bacteremia due to Gram-negative bacteria 01/30/2014  . UTI (urinary tract infection), bacterial 01/29/2014  . DKA, type 1 (HCC) 01/29/2014  . Diabetes type 1, uncontrolled (HCC) 11/26/2010  . Dehydration 11/26/2010  . 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 Thyroiditis, autoimmune   . Hyperthyroidism 10/14/2010    History reviewed. No pertinent surgical history.  OB History   No obstetric history on file.      Home Medications    Prior to Admission medications   Medication Sig Start Date End Date Taking? Authorizing Provider  Insulin Aspart (NOVOLOG FLEXPEN [place]) Inject 0-15 Units into the skin 4 (four) times daily as needed (blood sugar).    Yes [provider]  insulin glargine (LANTUS) 100 UNIT/ML injection Inject 0.18 mLs (18 Units  total) into the skin daily. 02/03/14  Yes Dellinger, Tora Kindred, PA-C  methimazole (TAPAZOLE) 5 MG tablet Take 15 mg by mouth 2 (two) times daily.   Yes [provider]  benzonatate (TESSALON) 200 MG capsule Take 1 capsule (200 mg total) by mouth 3 (three) times daily as needed for up to 7 days for cough. 05/28/20 06/04/20  Dorean Hiebert C, PA-C  drospirenone-ethinyl estradiol (YAZ) 3-0.02 MG tablet Take 1 tablet by mouth daily.    [provider]  fluticasone (FLONASE) 50 MCG/ACT nasal spray Place 1-2 sprays into both nostrils daily. 05/28/20   Tom Macpherson C, PA-C  ibuprofen (ADVIL) 800 MG tablet Take 1 tablet (800 mg total) by mouth 3 (three) times daily. 05/28/20   Nick Armel C, PA-C  loratadine (CLARITIN) 10 MG tablet Take 1 tablet (10 mg total) by mouth daily. 06/20/19   Hall-Potvin, [place], PA-C  metroNIDAZOLE (FLAGYL) 500 MG tablet Take 1 tablet (500 mg total) by mouth 2 (two) times daily. 03/22/19   Cathie Hoops, Amy V, PA-C  neomycin-polymyxin-hydrocortisone (CORTISPORIN) OTIC solution Place 3 drops into the left ear 3 (three) times daily. 12/23/19   Hall-Potvin, [place], PA-C    Family History Family History  Problem Relation Age of Onset  . Diabetes Father   . Diabetes Paternal Aunt   . Thyroid disease Maternal Grandmother   . Heart disease Maternal Grandfather     Social History Social  History   Tobacco Use  . Smoking status: Never Smoker  . Smokeless tobacco: Never Used  Substance Use Topics  . Alcohol use: Yes    Comment: occasionally  . Drug use: No     Allergies   Patient has no known allergies.   Review of Systems Review of Systems  Constitutional: Positive for chills, fatigue and fever. Negative for activity change and appetite change.  HENT: Positive for congestion and rhinorrhea. Negative for ear pain, sinus pressure, sore throat and trouble swallowing.   Eyes: Negative for discharge and redness.  Respiratory: Positive for cough. Negative  for chest tightness and shortness of breath.   Cardiovascular: Negative for chest pain.  Gastrointestinal: Negative for abdominal pain, diarrhea, nausea and vomiting.  Musculoskeletal: Positive for myalgias.  Skin: Negative for rash.  Neurological: Positive for headaches. Negative for dizziness and light-headedness.     Physical Exam Triage Vital Signs ED Triage Vitals  Enc Vitals Group     BP      Pulse      Resp      Temp      Temp src      SpO2      Weight      Height      Head Circumference      Peak Flow      Pain Score      Pain Loc      Pain Edu?      Excl. in GC?    No data found.  Updated Vital Signs BP 104/61 (BP Location: Left Arm)   Pulse 60   Temp 98.1 F (36.7 C) (Oral)   Resp 18   SpO2 97%   Visual Acuity Right Eye Distance:   Left Eye Distance:   Bilateral Distance:    Right Eye Near:   Left Eye Near:    Bilateral Near:     Physical Exam Vitals and nursing note reviewed.  Constitutional:      Appearance: She is well-developed.     Comments: No acute distress  HENT:     Head: Normocephalic and atraumatic.     Ears:     Comments: Bilateral ears without tenderness to palpation of external auricle, tragus and mastoid, EAC's without erythema or swelling, TM's with good bony landmarks and cone of light. Non erythematous.     Nose: Nose normal.     Mouth/Throat:     Comments: Oral mucosa pink and moist, no tonsillar enlargement or exudate. Posterior pharynx patent and nonerythematous, no uvula deviation or swelling. Normal phonation. Eyes:     Conjunctiva/sclera: Conjunctivae normal.  Cardiovascular:     Rate and Rhythm: Normal rate and regular rhythm.  Pulmonary:     Effort: Pulmonary effort is normal. No respiratory distress.     Comments: Breathing comfortably at rest, CTABL, no wheezing, rales or other adventitious sounds auscultated Abdominal:     General: There is no distension.  Musculoskeletal:        General: Normal range of  motion.     Cervical back: Neck supple.  Skin:    General: Skin is warm and dry.  Neurological:     Mental Status: She is alert and oriented to person, place, and time.      UC Treatments / Results  Labs (all labs ordered are listed, but only abnormal results are displayed) Labs Reviewed  NOVEL CORONAVIRUS, NAA    EKG   Radiology No results found.  Procedures Procedures (including critical care  time)  Medications Ordered in UC Medications - No data to display  Initial Impression / Assessment and Plan / UC Course  I have reviewed the triage vital signs and the nursing notes.  Pertinent labs & imaging results that were available during my care of the patient were reviewed by me and considered in my medical decision making (see chart for details).    URI symptoms x3 days, exam reassuring, vital signs stable, recommending symptomatic and supportive care rest and fluids.  Covid test pending.  Discussed strict return precautions. Patient verbalized understanding and is agreeable with plan.   Final Clinical Impressions(s) / UC Diagnoses   Final diagnoses:  Encounter for screening for COVID-19  Viral URI with cough     Discharge Instructions     COVID test pending May use Flonase for nasal congestion Tessalon for cough every 8 hours As alternative may use other over-the-counter medicine to help with cough and congestion Rest and fluids Tylenol and ibuprofen as needed Follow-up with any symptoms not improving or worsening     ED Prescriptions    Medication Sig Dispense Auth. Provider   benzonatate (TESSALON) 200 MG capsule Take 1 capsule (200 mg total) by mouth 3 (three) times daily as needed for up to 7 days for cough. 28 capsule Nasario Czerniak C, PA-C   fluticasone (FLONASE) 50 MCG/ACT nasal spray Place 1-2 sprays into both nostrils daily. 16 g Eliberto Sole C, PA-C   ibuprofen (ADVIL) 800 MG tablet Take 1 tablet (800 mg total) by mouth 3 (three) times  daily. 21 tablet Meldon Hanzlik, [place] C, PA-C     PDMP not reviewed this encounter.   Lew Dawes, [place] 05/28/20 [phone]

## 2020-05-28 NOTE — ED Triage Notes (Addendum)
Patient reports cough, nasal congestion, SOB, bodyaches and fatigue since Saturday. On Saturday may of had fever.   Taken ibuprofen and mucinex.

## 2020-05-28 NOTE — Discharge Instructions (Addendum)
COVID test pending May use Flonase for nasal congestion Tessalon for cough every 8 hours As alternative may use other over-the-counter medicine to help with cough and congestion Rest and fluids Tylenol and ibuprofen as needed Follow-up with any symptoms not improving or worsening

## 2020-05-30 LAB — SARS-COV-2, NAA 2 DAY TAT

## 2020-05-30 LAB — NOVEL CORONAVIRUS, NAA: SARS-CoV-2, NAA: NOT DETECTED

## 2021-07-21 ENCOUNTER — Other Ambulatory Visit: Payer: Self-pay

## 2021-07-21 ENCOUNTER — Inpatient Hospital Stay (HOSPITAL_COMMUNITY)
Admission: AD | Admit: 2021-07-21 | Discharge: 2021-07-22 | Disposition: A | Discharge status: Home / Self Care | Payer: 59 | Attending: Obstetrics & Gynecology | Admitting: Obstetrics & Gynecology

## 2021-07-21 DIAGNOSIS — O208 Other hemorrhage in early pregnancy: Secondary | ICD-10-CM | POA: Insufficient documentation

## 2021-07-21 DIAGNOSIS — O209 Hemorrhage in early pregnancy, unspecified: Secondary | ICD-10-CM | POA: Insufficient documentation

## 2021-07-21 DIAGNOSIS — O418X20 Other specified disorders of amniotic fluid and membranes, second trimester, not applicable or unspecified: Secondary | ICD-10-CM

## 2021-07-21 DIAGNOSIS — Z3A13 13 weeks gestation of pregnancy: Secondary | ICD-10-CM | POA: Insufficient documentation

## 2021-07-21 DIAGNOSIS — O468X2 Other antepartum hemorrhage, second trimester: Secondary | ICD-10-CM

## 2021-07-21 HISTORY — DX: Thyrotoxicosis, unspecified without thyrotoxic crisis or storm: E05.90

## 2021-07-21 HISTORY — DX: Hypothyroidism, unspecified: E03.9

## 2021-07-22 ENCOUNTER — Encounter (HOSPITAL_COMMUNITY): Payer: Self-pay

## 2021-07-22 ENCOUNTER — Inpatient Hospital Stay (HOSPITAL_COMMUNITY): Payer: 59

## 2021-07-22 DIAGNOSIS — O209 Hemorrhage in early pregnancy, unspecified: Secondary | ICD-10-CM | POA: Diagnosis present

## 2021-07-22 DIAGNOSIS — O468X2 Other antepartum hemorrhage, second trimester: Secondary | ICD-10-CM

## 2021-07-22 DIAGNOSIS — O418X20 Other specified disorders of amniotic fluid and membranes, second trimester, not applicable or unspecified: Secondary | ICD-10-CM | POA: Diagnosis not present

## 2021-07-22 DIAGNOSIS — Z3A13 13 weeks gestation of pregnancy: Secondary | ICD-10-CM | POA: Diagnosis not present

## 2021-07-22 DIAGNOSIS — O208 Other hemorrhage in early pregnancy: Secondary | ICD-10-CM | POA: Diagnosis not present

## 2021-07-22 MED ORDER — IBUPROFEN 800 MG PO TABS
400.0000 mg | ORAL_TABLET | Freq: Once | ORAL | Status: AC
  Administered 2021-07-22: 400 mg via ORAL
  Filled 2021-07-22: qty 1

## 2021-07-22 NOTE — MAU Provider Note (Signed)
Chief Complaint: Vaginal Bleeding   Event Date/Time   First Provider Initiated Contact with Patient 07/22/21 0107        SUBJECTIVE HPI: Angela Meyer is a 26 y.o. G1P0 at [redacted]w[redacted]d by LMP who presents to maternity admissions reporting vaginal bleeding tonight.  No recent intercourse.  States had [place] in office which showed a small hematoma. . She denies urinary symptoms, h/a, dizziness, n/v, or fever/chills.    Vaginal Bleeding The patient's primary symptoms include vaginal bleeding. The patient's pertinent negatives include no genital itching, genital lesions, genital odor or pelvic pain. This is a new problem. The current episode started today. The patient is experiencing no pain. She is pregnant. Pertinent negatives include no abdominal pain, back pain, chills, fever, headaches, nausea or vomiting. The vaginal discharge was bloody. The vaginal bleeding is spotting. She has not been passing clots. She has not been passing tissue. Nothing aggravates the symptoms. She has tried nothing for the symptoms.   Past Medical History:  Diagnosis Date   Chronic autoimmune thyroiditis    Diabetes mellitus    Dyspepsia    Fetal tachycardia before the onset of labor    Goiter with hyperthyroidism    Hyperthyroidism    Hypothyroidism    Irritability    Irritability    Tachycardia    Thyroiditis, autoimmune    Thyrotoxicosis with diffuse goiter    Tremor    Tremor    Weight loss    Weight loss, abnormal    Past Surgical History:  Procedure Laterality Date   NO PAST SURGERIES     Social History   Socioeconomic History   Marital status: Single    Spouse name: Not on file   Number of children: Not on file   Years of education: Not on file   Highest education level: Not on file  Occupational History   Not on file  Tobacco Use   Smoking status: Never   Smokeless tobacco: Never  Substance and Sexual Activity   Alcohol use: Yes    Comment: occasionally   Drug use: No   Sexual activity:  Yes  Other Topics Concern   Not on file  Social History Narrative   Not on file   Social Determinants of Health   Financial Resource Strain: Not on file  Food Insecurity: Not on file  Transportation Needs: Not on file  Physical Activity: Not on file  Stress: Not on file  Social Connections: Not on file  Intimate Partner Violence: Not on file   No current facility-administered medications on file prior to encounter.   Current Outpatient Medications on File Prior to Encounter  Medication Sig Dispense Refill   fluticasone (FLONASE) 50 MCG/ACT nasal spray Place 1-2 sprays into both nostrils daily. 16 g 0   ibuprofen (ADVIL) 800 MG tablet Take 1 tablet (800 mg total) by mouth 3 (three) times daily. 21 tablet 0   Insulin Aspart (NOVOLOG FLEXPEN [place]) Inject 0-15 Units into the skin 4 (four) times daily as needed (blood sugar).      insulin glargine (LANTUS) 100 UNIT/ML injection Inject 0.18 mLs (18 Units total) into the skin daily. 10 mL 11   insulin lispro (HUMALOG) 100 UNIT/ML injection Inject into the skin 3 (three) times daily before meals.     loratadine (CLARITIN) 10 MG tablet Take 1 tablet (10 mg total) by mouth daily. 30 tablet 0   methimazole (TAPAZOLE) 5 MG tablet Take 15 mg by mouth 2 (two) times daily.  metroNIDAZOLE (FLAGYL) 500 MG tablet Take 1 tablet (500 mg total) by mouth 2 (two) times daily. 14 tablet 0   neomycin-polymyxin-hydrocortisone (CORTISPORIN) OTIC solution Place 3 drops into the left ear 3 (three) times daily. 10 mL 0   propylthiouracil (PTU) 50 MG tablet Take 50 mg by mouth 3 (three) times daily.     drospirenone-ethinyl estradiol (YAZ) 3-0.02 MG tablet Take 1 tablet by mouth daily.     Allergies  Allergen Reactions   Humalog [Insulin Lispro]     I have reviewed patient's Past Medical Hx, Surgical Hx, Family Hx, Social Hx, medications and allergies.   ROS:  Review of Systems  Constitutional:  Negative for chills and fever.  Gastrointestinal:  Negative  for abdominal pain, nausea and vomiting.  Genitourinary:  Positive for vaginal bleeding. Negative for pelvic pain.  Musculoskeletal:  Negative for back pain.  Neurological:  Negative for headaches.  Review of Systems  Other systems negative   Physical Exam  Physical Exam Patient Vitals for the past 24 hrs:  BP Temp Temp src Pulse Resp  07/22/21 0051 118/76 99.3 F (37.4 C) Oral 83 14   Constitutional: Well-developed, well-nourished female in no acute distress.  Cardiovascular: normal rate Respiratory: normal effort GI: Abd soft, non-tender. Pos BS x 4 MS: Extremities nontender, no edema, normal ROM Neurologic: Alert and oriented x 4.  GU: Neg CVAT.  PELVIC EXAM: Cervix pink, visually closed, without lesion, scant red tinged  discharge, vaginal walls and external genitalia normal  FHT 152 by doppler  LAB RESULTS No results found for this or any previous visit (from the past 24 hour(s)).   IMAGING US OB Comp Less 14 Wks  Result Date: 07/22/2021 CLINICAL DATA:  Pregnant, bleeding EXAM: OBSTETRIC <14 WK ULTRASOUND TECHNIQUE: Transabdominal ultrasound was performed for evaluation of the gestation as well as the maternal uterus and adnexal regions. COMPARISON:  None. FINDINGS: Intrauterine gestational sac: Single Yolk sac:  Not Visualized. Embryo:  Visualized. Cardiac Activity: Visualized. Heart Rate: 151 bpm CRL:   69.5 mm   13 w 1 d                  [place] EDC: 01/26/2022 Subchorionic hemorrhage:  Small subchronic hemorrhage. Maternal uterus/adnexae: Bilateral ovaries are within normal limits. No free fluid. IMPRESSION: Single intrauterine gestation with cardiac activity, measuring 13 weeks 1 day by crown-rump length, as above. Electronically Signed   By: Charline Bills M.D.   On: 07/22/2021 02:10     MAU Management/MDM: Speculum exam showed closed cervix, small amount of blood.  Obvious source Ordered Ultrasound to assess for presence of [facility].  Discussed nature of subchorionic  hemorrhage, and data shows small ones usually are not associated with miscarriage Recommend pelvic rest  ASSESSMENT Single iUP at [redacted]w[redacted]d Small subchorionic hemorrhage  PLAN Discharge home Pelvic rest Followup in office (has appt this week)  Pt stable at time of discharge. Encouraged to return here if she develops worsening of symptoms, increase in pain, fever, or other concerning symptoms.    Wynelle Bourgeois CNM, MSN Certified Nurse-Midwife 07/22/2021  1:07 AM

## 2021-08-07 ENCOUNTER — Other Ambulatory Visit: Payer: Self-pay

## 2021-08-07 ENCOUNTER — Ambulatory Visit: Payer: 59 | Admitting: Cardiology

## 2021-08-07 ENCOUNTER — Encounter: Payer: Self-pay | Admitting: Cardiology

## 2021-08-07 VITALS — BP 117/68 | HR 70 | Temp 98.3°F | Resp 16 | Ht 62.0 in | Wt 154.0 lb

## 2021-08-07 DIAGNOSIS — Z8249 Family history of ischemic heart disease and other diseases of the circulatory system: Secondary | ICD-10-CM

## 2021-08-07 DIAGNOSIS — Z136 Encounter for screening for cardiovascular disorders: Secondary | ICD-10-CM

## 2021-08-07 NOTE — Progress Notes (Signed)
Patient referred by Betsey Amen, MD for screening EKG  Subjective:   Angela Meyer, female    DOB: [birth year], 25 y.o.   MRN: [id number]   Chief Complaint  Patient presents with   Poorly controlled DM   Family Hx of CHF   New Patient (Initial Visit)    Referred by Clance Boll, DO     HPI  26 y.o. African-American female with h/o Graves' disease, insulin-dependent type 1 diabetes mellitus, referred for cardiac evaluation during pregnancy.  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  She is currently [redacted] weeks pregnant with her first child.  She has type 1 diabetes mellitus that has been labile throughout the years.  She also has a history of Graves' disease, which has been fairly controlled during her pregnancy.  She denies any chest pain or shortness of breath symptoms.  She is non-smoker.  She does not have any premature CAD history in the family.   Past Medical History:  Diagnosis Date   Chronic autoimmune thyroiditis    Diabetes mellitus    Dyspepsia    Fetal tachycardia before the onset of labor    Goiter with hyperthyroidism    Hyperthyroidism    Hypothyroidism    Irritability    Irritability    Tachycardia    Thyroiditis, autoimmune    Thyrotoxicosis with diffuse goiter    Tremor    Tremor    Weight loss    Weight loss, abnormal      Past Surgical History:  Procedure Laterality Date   NO PAST SURGERIES       Social History   Tobacco Use  Smoking Status Never  Smokeless Tobacco Never    Social History   Substance and Sexual Activity  Alcohol Use Yes   Comment: occasionally     Family History  Problem Relation Age of Onset   Diabetes Father    Congenital heart disease Father    Diabetes Paternal Aunt    Thyroid disease Maternal Grandmother    Heart disease Maternal Grandfather      Current Outpatient Medications on File Prior to Visit  Medication Sig Dispense Refill   insulin lispro (HUMALOG) 100 UNIT/ML injection Inject into the skin 3  (three) times daily before meals.     TOUJEO MAX SOLOSTAR 300 UNIT/ML Solostar Pen Inject 32 Units into the skin at bedtime.     No current facility-administered medications on file prior to visit.    Cardiovascular and other pertinent studies:  EKG 08/07/2021: Sinus rhythm 69 bpm Normal EKG   Recent labs: 07/04/2021: H/H 13/38. MCV 84. Platelets 338 TSH 0.7 normal  06/18/2021: H/H 13/38. MCV 84. Platelets 338 TSH 2.0 normal   Review of Systems  Cardiovascular:  Negative for chest pain, dyspnea on exertion, leg swelling, palpitations and syncope.       Vitals:   08/07/21 1300  BP: 117/68  Pulse: 70  Resp: 16  Temp: 98.3 F (36.8 C)  SpO2: 99%     Body mass index is 28.17 kg/m. Filed Weights   08/07/21 1300  Weight: 154 lb (69.9 kg)     Objective:   Physical Exam Vitals and nursing note reviewed.  Constitutional:      General: She is not in acute distress. Neck:     Vascular: No JVD.  Cardiovascular:     Rate and Rhythm: Normal rate and regular rhythm.     Heart sounds: Normal heart sounds. No murmur heard. Pulmonary:  Effort: Pulmonary effort is normal.     Breath sounds: Normal breath sounds. No wheezing or rales.        Assessment & Recommendations:    26 y.o. African-American female with h/o Graves' disease, insulin-dependent type 1 diabetes mellitus, referred for cardiac evaluation during pregnancy.  Normal physical exam, resting EKG.  No symptoms suggestive of any significant cardiac disease.  Continue management of type 1 diabetes mellitus.  In future, I do recommend screening lipid panel, after her pregnancy.  I will see her on as-needed basis.  Thank you for referring the patient to [place]. Please feel free to contact with any questions.   Elder Negus, MD Pager: [phone] Office: [phone]

## 2021-08-08 ENCOUNTER — Encounter: Payer: Self-pay | Admitting: Cardiology

## 2021-08-08 DIAGNOSIS — Z8249 Family history of ischemic heart disease and other diseases of the circulatory system: Secondary | ICD-10-CM | POA: Insufficient documentation

## 2021-08-23 ENCOUNTER — Ambulatory Visit
Admission: EM | Admit: 2021-08-23 | Discharge: 2021-08-23 | Disposition: A | Discharge status: Home / Self Care | Payer: 59 | Attending: Physician Assistant | Admitting: Physician Assistant

## 2021-08-23 ENCOUNTER — Other Ambulatory Visit: Payer: Self-pay

## 2021-08-23 DIAGNOSIS — J019 Acute sinusitis, unspecified: Secondary | ICD-10-CM | POA: Diagnosis not present

## 2021-08-23 LAB — POCT RAPID STREP A (OFFICE): Rapid Strep A Screen: NEGATIVE

## 2021-08-23 MED ORDER — AMOXICILLIN-POT CLAVULANATE 875-125 MG PO TABS: 1.0000 | ORAL_TABLET | Freq: Two times a day (BID) | ORAL | 0 refills | Status: DC

## 2021-08-23 NOTE — ED Provider Notes (Signed)
?[name] ? ? ? ?CSN: [id number] ?Arrival date & time: 08/23/21  1742 ? ? ?  ? ?History   ?Chief Complaint ?Chief Complaint  ?Patient presents with  ? Nasal Congestion  ? ? ?HPI ?Angela Meyer is a 26 y.o. female.  ? ?Patient here today for evaluation of congestion, cough, runny nose that has been ongoing for the last 5 to 6 days.  She reports significant sinus pressure as well as some ear pain.  She has started to have some sore throat that started today.  She denies any fever.  She has tried allergy medication without significant relief.  Patient reports she is currently pregnant. ? ?The history is provided by the patient.  ? ?Past Medical History:  ?Diagnosis Date  ? Chronic autoimmune thyroiditis   ? Diabetes mellitus   ? Dyspepsia   ? Fetal tachycardia before the onset of labor   ? Goiter with hyperthyroidism   ? Hyperthyroidism   ? Hypothyroidism   ? Irritability   ? Irritability   ? Tachycardia   ? Thyroiditis, autoimmune   ? Thyrotoxicosis with diffuse goiter   ? Tremor   ? Tremor   ? Weight loss   ? Weight loss, abnormal   ? ? ?Patient Active Problem List  ? Diagnosis Date Noted  ? Family history of CHF (congestive heart failure) 08/08/2021  ? Pyelonephritis 01/30/2014  ? Bacteremia due to Gram-negative bacteria 01/30/2014  ? UTI (urinary tract infection), bacterial 01/29/2014  ? DKA, type 1 (HCC) 01/29/2014  ? Diabetes type 1, uncontrolled 11/26/2010  ? Dehydration 11/26/2010  ? 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 Thyroiditis, autoimmune   ? Hyperthyroidism 10/14/2010  ? ? ?Past Surgical History:  ?Procedure Laterality Date  ? NO PAST SURGERIES    ? ? ?OB History   ? ? Gravida  ?1  ? Para  ?   ? Term  ?   ? Preterm  ?   ? AB  ?   ? Living  ?   ?  ? ? SAB  ?   ? IAB  ?   ? Ectopic  ?   ? Multiple  ?   ? Live Births  ?   ?   ?  ?  ? ? ? ?Home Medications   ? ?Prior to Admission medications   ?Medication Sig Start Date End Date Taking? Authorizing Provider  ?amoxicillin-clavulanate (AUGMENTIN)  875-125 MG tablet Take 1 tablet by mouth every 12 (twelve) hours. 08/23/21  Yes Tomi Bamberger, PA-C  ?insulin lispro (HUMALOG) 100 UNIT/ML injection Inject into the skin 3 (three) times daily before meals.    [provider]  ?Myrlene Broker SOLOSTAR 300 UNIT/ML Solostar Pen Inject 32 Units into the skin at bedtime. 07/28/21   [provider]  ? ? ?Family History ?Family History  ?Problem Relation Age of Onset  ? Diabetes Father   ? Congenital heart disease Father   ? Diabetes Paternal Aunt   ? Thyroid disease Maternal Grandmother   ? Heart disease Maternal Grandfather   ? ? ?Social History ?Social History  ? ?Tobacco Use  ? Smoking status: Never  ? Smokeless tobacco: Never  ?Vaping Use  ? Vaping Use: Never used  ?Substance Use Topics  ? Alcohol use: Yes  ?  Comment: occasionally  ? Drug use: No  ? ? ? ?Allergies   ?Patient has no known allergies. ? ? ?Review of Systems ?Review of Systems  ?Constitutional:  Negative for chills and  fever.  ?HENT:  Positive for congestion, ear pain, sinus pressure and sore throat.   ?Eyes:  Negative for discharge and redness.  ?Respiratory:  Positive for cough. Negative for shortness of breath and wheezing.   ?Gastrointestinal:  Negative for abdominal pain, diarrhea, nausea and vomiting.  ? ? ?Physical Exam ?Triage Vital Signs ?ED Triage Vitals  ?Enc Vitals Group  ?   BP   ?   Pulse   ?   Resp   ?   Temp   ?   Temp src   ?   SpO2   ?   Weight   ?   Height   ?   Head Circumference   ?   Peak Flow   ?   Pain Score   ?   Pain Loc   ?   Pain Edu?   ?   Excl. in GC?   ? ?No data found. ? ?Updated Vital Signs ?BP 126/76 (BP Location: Left Arm)   Pulse 69   Temp 98.2 ?F (36.8 ?C) (Oral)   Resp 18   SpO2 98%  ? ?Physical Exam ?Vitals and nursing note reviewed.  ?Constitutional:   ?   General: She is not in acute distress. ?   Appearance: Normal appearance. She is not ill-appearing.  ?HENT:  ?   Head: Normocephalic and atraumatic.  ?   Right Ear: Tympanic membrane normal.  ?    Left Ear: Tympanic membrane normal.  ?   Nose: Congestion present.  ?   Mouth/Throat:  ?   Mouth: Mucous membranes are moist.  ?   Pharynx: No oropharyngeal exudate or posterior oropharyngeal erythema.  ?Eyes:  ?   Conjunctiva/sclera: Conjunctivae normal.  ?Cardiovascular:  ?   Rate and Rhythm: Normal rate and regular rhythm.  ?   Heart sounds: Normal heart sounds. No murmur heard. ?Pulmonary:  ?   Effort: Pulmonary effort is normal. No respiratory distress.  ?   Breath sounds: Normal breath sounds. No wheezing, rhonchi or rales.  ?Skin: ?   General: Skin is warm and dry.  ?Neurological:  ?   Mental Status: She is alert.  ?Psychiatric:     ?   Mood and Affect: Mood normal.     ?   Thought Content: Thought content normal.  ? ? ? ?UC Treatments / Results  ?Labs ?(all labs ordered are listed, but only abnormal results are displayed) ?Labs Reviewed  ?NOVEL CORONAVIRUS, NAA  ?POCT RAPID STREP A (OFFICE)  ? ? ?EKG ? ? ?Radiology ?No results found. ? ?Procedures ?Procedures (including critical care time) ? ?Medications Ordered in UC ?Medications - No data to display ? ?Initial Impression / Assessment and Plan / UC Course  ?I have reviewed the triage vital signs and the nursing notes. ? ?Pertinent labs & imaging results that were available during my care of the patient were reviewed by me and considered in my medical decision making (see chart for details). ? ?  ?We will treat to cover sinusitis with Augmentin.  COVID screening ordered.  Strep test negative in office.  Encouraged follow-up with any further concerns. ? ?Final Clinical Impressions(s) / UC Diagnoses  ? ?Final diagnoses:  ?Acute sinusitis, recurrence not specified, unspecified location  ? ?Discharge Instructions   ?None ?  ? ?ED Prescriptions   ? ? Medication Sig Dispense Auth. Provider  ? amoxicillin-clavulanate (AUGMENTIN) 875-125 MG tablet Take 1 tablet by mouth every 12 (twelve) hours. 14 tablet Tomi Bamberger, PA-C  ? ?  ? ?  PDMP not reviewed this  encounter. ?  ?Tomi Bamberger, PA-C ?08/23/21 1916 ? ?

## 2021-08-23 NOTE — ED Triage Notes (Signed)
4 day h/o nasal congestion, cough, runny nose and onset yesterday of sore throat with dysphagia. ?5 days ago Pt reports that she had chills for one day that resolved independently. ?Has been taking otc allergy meds w/o relief. No v/d. ? ?Pt is pregnant. ?

## 2021-08-24 LAB — NOVEL CORONAVIRUS, NAA: SARS-CoV-2, NAA: NOT DETECTED

## 2021-10-08 ENCOUNTER — Other Ambulatory Visit: Payer: Self-pay | Admitting: Obstetrics and Gynecology

## 2021-10-08 DIAGNOSIS — Z363 Encounter for antenatal screening for malformations: Secondary | ICD-10-CM

## 2021-10-13 ENCOUNTER — Other Ambulatory Visit: Payer: Self-pay

## 2021-10-13 ENCOUNTER — Ambulatory Visit
Admission: EM | Admit: 2021-10-13 | Discharge: 2021-10-13 | Disposition: A | Discharge status: Home / Self Care | Payer: 59 | Attending: Urgent Care | Admitting: Urgent Care

## 2021-10-13 DIAGNOSIS — M79671 Pain in right foot: Secondary | ICD-10-CM

## 2021-10-13 MED ORDER — CEPHALEXIN 500 MG PO CAPS: 500.0000 mg | ORAL_CAPSULE | Freq: Four times a day (QID) | ORAL | 0 refills | Status: DC

## 2021-10-13 NOTE — Discharge Instructions (Signed)
Please call podiatry tomorrow to schedule a follow-up for assessment of possible foreign body. ?Please take the antibiotic prescribed. ?Soak your foot in Epsom salt water to act as a drawing salve. ?You could also try duct tape to the area to see if this is a plantar wart. ? ?

## 2021-10-13 NOTE — ED Triage Notes (Signed)
Pt c/o stepping on glass when exiting shower ~2 weeks ago. States she thought she got all of the glass out but this week it has been hurting and is forming a mass under skin. Pt has DM.  ?

## 2021-10-13 NOTE — ED Provider Notes (Signed)
?[name] ? ? ? ?CSN: [id number] ?Arrival date & time: 10/13/21  1044 ? ? ?  ? ?History   ?Chief Complaint ?Chief Complaint  ?Patient presents with  ? glass in right foot  ? ? ?HPI ?[place] A Haluska is a 26 y.o. female.  ? ?Pleasant 26 year old female presents today with concern of right foot injury.  She states 2 weeks ago she stepped out of her shower onto a fluffy shag carpet/ rug.  At that time, she thought she stepped on a piece of glass.  She recalls pulling something out of her foot.  Now, 2 weeks later.  She reports pain in a similar area to the previous possible puncture wound.  She reported it hurts to walk.  It now has a black spot and a lump.  She is [redacted] weeks pregnant and also diabetic.  She states her sugars are "not good".  She denies fever or chills.  She denies lymphangitis.  She denies any drainage from the area. ? ? ? ?Past Medical History:  ?Diagnosis Date  ? Chronic autoimmune thyroiditis   ? Diabetes mellitus   ? Dyspepsia   ? Fetal tachycardia before the onset of labor   ? Goiter with hyperthyroidism   ? Hyperthyroidism   ? Hypothyroidism   ? Irritability   ? Irritability   ? Tachycardia   ? Thyroiditis, autoimmune   ? Thyrotoxicosis with diffuse goiter   ? Tremor   ? Tremor   ? Weight loss   ? Weight loss, abnormal   ? ? ?Patient Active Problem List  ? Diagnosis Date Noted  ? Family history of CHF (congestive heart failure) 08/08/2021  ? Pyelonephritis 01/30/2014  ? Bacteremia due to Gram-negative bacteria 01/30/2014  ? UTI (urinary tract infection), bacterial 01/29/2014  ? DKA, type 1 (HCC) 01/29/2014  ? Diabetes type 1, uncontrolled 11/26/2010  ? Dehydration 11/26/2010  ? 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 Thyroiditis, autoimmune   ? Hyperthyroidism 10/14/2010  ? ? ?Past Surgical History:  ?Procedure Laterality Date  ? NO PAST SURGERIES    ? ? ?OB History   ? ? Gravida  ?1  ? Para  ?   ? Term  ?   ? Preterm  ?   ? AB  ?   ? Living  ?   ?  ? ? SAB  ?   ? IAB  ?   ? Ectopic  ?   ?  Multiple  ?   ? Live Births  ?   ?   ?  ?  ? ? ? ?Home Medications   ? ?Prior to Admission medications   ?Medication Sig Start Date End Date Taking? Authorizing Provider  ?cephALEXin (KEFLEX) 500 MG capsule Take 1 capsule (500 mg total) by mouth 4 (four) times daily. 10/13/21  Yes Francyne Arreaga L, PA  ?insulin lispro (HUMALOG) 100 UNIT/ML injection Inject into the skin 3 (three) times daily before meals.    [provider]  ?Myrlene Broker SOLOSTAR 300 UNIT/ML Solostar Pen Inject 32 Units into the skin at bedtime. 07/28/21   [provider]  ? ? ?Family History ?Family History  ?Problem Relation Age of Onset  ? Diabetes Father   ? Congenital heart disease Father   ? Diabetes Paternal Aunt   ? Thyroid disease Maternal Grandmother   ? Heart disease Maternal Grandfather   ? ? ?Social History ?Social History  ? ?Tobacco Use  ? Smoking status: Never  ? Smokeless tobacco: Never  ?  Vaping Use  ? Vaping Use: Never used  ?Substance Use Topics  ? Alcohol use: Yes  ?  Comment: occasionally  ? Drug use: No  ? ? ? ?Allergies   ?Patient has no known allergies. ? ? ?Review of Systems ?Review of Systems  ?Skin:  Positive for wound (R plantar surface foot).  ?All other systems reviewed and are negative. ? ? ?Physical Exam ?Triage Vital Signs ?ED Triage Vitals [10/13/21 1129]  ?Enc Vitals Group  ?   BP 110/78  ?   Pulse Rate 100  ?   Resp 18  ?   Temp 98 ?F (36.7 ?C)  ?   Temp Source Oral  ?   SpO2 98 %  ?   Weight   ?   Height   ?   Head Circumference   ?   Peak Flow   ?   Pain Score 0  ?   Pain Loc   ?   Pain Edu?   ?   Excl. in GC?   ? ?No data found. ? ?Updated Vital Signs ?BP 110/78 (BP Location: Left Arm)   Pulse 100   Temp 98 ?F (36.7 ?C) (Oral)   Resp 18   SpO2 98%  ? ?Visual Acuity ?Right Eye Distance:   ?Left Eye Distance:   ?Bilateral Distance:   ? ?Right Eye Near:   ?Left Eye Near:    ?Bilateral Near:    ? ?Physical Exam ?Vitals and nursing note reviewed. Exam conducted with a chaperone present.   ?Constitutional:   ?   General: She is not in acute distress. ?   Appearance: Normal appearance. She is normal weight. She is not ill-appearing, toxic-appearing or diaphoretic.  ?HENT:  ?   Head: Normocephalic and atraumatic.  ?Musculoskeletal:     ?   General: Swelling, tenderness and signs of injury present. No deformity. Normal range of motion.  ?   Right lower leg: No edema.  ?   Left lower leg: No edema.  ?   Comments: There is a round lesion to the plantar aspect of R foot. It is painful when touched. It has a small opening in the center, appears to have a black head internally. Mild tenderness surrounding the affected ares. Minimal erythema without warmth. No drainage or bleeding. Sensation intact.  ?Neurological:  ?   Mental Status: She is alert.  ? ? ? ?UC Treatments / Results  ?Labs ?(all labs ordered are listed, but only abnormal results are displayed) ?Labs Reviewed - No data to display ? ?EKG ? ? ?Radiology ?No results found. ? ?Procedures ?Procedures (including critical care time) ? ?Medications Ordered in UC ?Medications - No data to display ? ?Initial Impression / Assessment and Plan / UC Course  ?I have reviewed the triage vital signs and the nursing notes. ? ?Pertinent labs & imaging results that were available during my care of the patient were reviewed by me and considered in my medical decision making (see chart for details). ? ?  ? ?Pain in R foot -clinically, the lesion looks like a plantar wart.  Patient states it started after a possible puncture wound from a piece of glass.  Patient was hoping for an x-ray, but she is [redacted] weeks pregnant.  Given known history of uncontrolled diabetes with possible puncture wound, will cover for possible developing infection.  I also recommended she use duct tape over the area as this may very well be a plantar wart.  Referral information given  to patient to follow-up with podiatry next week for further assessment, may need ultrasound to assess for foreign  body.  Warm Epsom salt soaks several times daily to act as a drawing salve. ? ?Final Clinical Impressions(s) / UC Diagnoses  ? ?Final diagnoses:  ?Pain in right foot  ? ? ? ?Discharge Instructions   ? ?  ?Please call podiatry tomorrow to schedule a follow-up for assessment of possible foreign body. ?Please take the antibiotic prescribed. ?Soak your foot in Epsom salt water to act as a drawing salve. ?You could also try duct tape to the area to see if this is a plantar wart. ? ? ? ? ? ?ED Prescriptions   ? ? Medication Sig Dispense Auth. Provider  ? cephALEXin (KEFLEX) 500 MG capsule Take 1 capsule (500 mg total) by mouth 4 (four) times daily. 20 capsule Muneer Leider L, PA  ? ?  ? ?PDMP not reviewed this encounter. ?  ?Deanie Jupiter,Maretta Bees Malaiya Paczkowski L, [place] ?10/13/21 1236 ? ?

## 2021-10-17 ENCOUNTER — Ambulatory Visit: Payer: 59 | Attending: Obstetrics and Gynecology

## 2021-10-17 ENCOUNTER — Ambulatory Visit: Payer: 59 | Admitting: *Deleted

## 2021-10-17 VITALS — BP 119/68 | HR 83

## 2021-10-17 DIAGNOSIS — O99280 Endocrine, nutritional and metabolic diseases complicating pregnancy, unspecified trimester: Secondary | ICD-10-CM

## 2021-10-17 DIAGNOSIS — Z363 Encounter for antenatal screening for malformations: Secondary | ICD-10-CM

## 2021-10-17 DIAGNOSIS — E0590 Thyrotoxicosis, unspecified without thyrotoxic crisis or storm: Secondary | ICD-10-CM | POA: Diagnosis not present

## 2021-10-17 DIAGNOSIS — O35EXX0 Maternal care for other (suspected) fetal abnormality and damage, fetal genitourinary anomalies, not applicable or unspecified: Secondary | ICD-10-CM | POA: Diagnosis not present

## 2021-10-17 DIAGNOSIS — E109 Type 1 diabetes mellitus without complications: Secondary | ICD-10-CM

## 2021-10-17 DIAGNOSIS — O24012 Pre-existing diabetes mellitus, type 1, in pregnancy, second trimester: Secondary | ICD-10-CM

## 2021-10-17 DIAGNOSIS — Z3A25 25 weeks gestation of pregnancy: Secondary | ICD-10-CM

## 2021-11-25 ENCOUNTER — Ambulatory Visit: Payer: 59 | Admitting: Podiatry

## 2021-12-03 ENCOUNTER — Encounter (HOSPITAL_COMMUNITY): Payer: Self-pay | Admitting: Obstetrics and Gynecology

## 2021-12-03 ENCOUNTER — Inpatient Hospital Stay (HOSPITAL_COMMUNITY)
Admission: AD | Admit: 2021-12-03 | Discharge: 2021-12-04 | Disposition: A | Discharge status: Home / Self Care | Payer: 59 | Attending: Obstetrics and Gynecology | Admitting: Obstetrics and Gynecology

## 2021-12-03 ENCOUNTER — Other Ambulatory Visit: Payer: Self-pay

## 2021-12-03 DIAGNOSIS — O24013 Pre-existing diabetes mellitus, type 1, in pregnancy, third trimester: Secondary | ICD-10-CM | POA: Insufficient documentation

## 2021-12-03 DIAGNOSIS — Z3A32 32 weeks gestation of pregnancy: Secondary | ICD-10-CM | POA: Insufficient documentation

## 2021-12-03 DIAGNOSIS — O1203 Gestational edema, third trimester: Secondary | ICD-10-CM | POA: Insufficient documentation

## 2021-12-03 DIAGNOSIS — Z794 Long term (current) use of insulin: Secondary | ICD-10-CM | POA: Insufficient documentation

## 2021-12-03 DIAGNOSIS — R030 Elevated blood-pressure reading, without diagnosis of hypertension: Secondary | ICD-10-CM | POA: Diagnosis present

## 2021-12-03 DIAGNOSIS — E109 Type 1 diabetes mellitus without complications: Secondary | ICD-10-CM | POA: Diagnosis not present

## 2021-12-03 LAB — URINALYSIS, ROUTINE W REFLEX MICROSCOPIC
Bilirubin Urine: NEGATIVE
Glucose, UA: >=500 mg/dL — AB
Hgb urine dipstick: NEGATIVE
Ketones, ur: NEGATIVE mg/dL
Leukocytes,Ua: NEGATIVE
Nitrite: NEGATIVE
Protein, ur: NEGATIVE mg/dL
Specific Gravity, Urine: 1.006 (ref 1.005–1.030)
pH: 6 (ref 5.0–8.0)

## 2021-12-03 NOTE — MAU Provider Note (Signed)
Chief Complaint:  Hypertension   Event Date/Time   First Provider Initiated Contact with Patient 12/03/21 2326      HPI: Angela Meyer is a 26 y.o. G1P0 at 5965w1d with Type 1 DM who presents to maternity admissions reporting elevated BP of 150s/90s at home today for 2 hours associated with swelling in her feet. She has no previous hx of HTN   She denies h/a or visual changes but does report some RUQ intermittent pain today.  She reports cramping but this is unchanged in last 2 weeks.   She reports good fetal movement.  HPI  Past Medical History: Past Medical History:  Diagnosis Date   Chronic autoimmune thyroiditis    Diabetes mellitus    Dyspepsia    Fetal tachycardia before the onset of labor    Goiter with hyperthyroidism    Hyperthyroidism    Hypothyroidism    Irritability    Irritability    Tachycardia    Thyroiditis, autoimmune    Thyrotoxicosis with diffuse goiter    Tremor    Tremor    Weight loss    Weight loss, abnormal     Past obstetric history: OB History  Gravida Para Term Preterm AB Living  1            SAB IAB Ectopic Multiple Live Births               # Outcome Date GA Lbr Len/2nd Weight Sex Delivery Anes PTL Lv  1 Current             Past Surgical History: Past Surgical History:  Procedure Laterality Date   NO PAST SURGERIES      Family History: Family History  Problem Relation Age of Onset   Diabetes Father    Congenital heart disease Father    Diabetes Paternal Aunt    Thyroid disease Maternal Grandmother    Heart disease Maternal Grandfather     Social History: Social History   Tobacco Use   Smoking status: Never   Smokeless tobacco: Never  Vaping Use   Vaping Use: Never used  Substance Use Topics   Alcohol use: Not Currently    Comment: not while preg   Drug use: No    Allergies: No Known Allergies  Meds:  Medications Prior to Admission  Medication Sig Dispense Refill Last Dose   insulin lispro (HUMALOG) 100 UNIT/ML  injection Inject into the skin 3 (three) times daily before meals.   12/03/2021   prenatal vitamin w/FE, FA (PRENATAL 1 + 1) 27-1 MG TABS tablet Take 1 tablet by mouth daily at 12 noon.   12/03/2021   TOUJEO MAX SOLOSTAR 300 UNIT/ML Solostar Pen Inject 42 Units into the skin at bedtime.   12/02/2021   cephALEXin (KEFLEX) 500 MG capsule Take 1 capsule (500 mg total) by mouth 4 (four) times daily. (Patient not taking: Reported on 10/17/2021) 20 capsule 0     ROS:  Review of Systems  Constitutional:  Negative for chills, fatigue and fever.  Eyes:  Negative for visual disturbance.  Respiratory:  Negative for shortness of breath.   Cardiovascular:  Positive for leg swelling. Negative for chest pain.  Gastrointestinal:  Positive for abdominal pain. Negative for nausea and vomiting.  Genitourinary:  Negative for difficulty urinating, dysuria, flank pain, pelvic pain, vaginal bleeding, vaginal discharge and vaginal pain.  Neurological:  Negative for dizziness and headaches.  Psychiatric/Behavioral: Negative.       I have reviewed patient's Past  Medical Hx, Surgical Hx, Family Hx, Social Hx, medications and allergies.   Physical Exam  Patient Vitals for the past 24 hrs:  BP Temp Pulse Resp SpO2 Height Weight  12/04/21 0031 133/85 -- 83 -- -- -- --  12/04/21 0016 134/78 -- 83 -- -- -- --  12/03/21 2335 (!) 132/93 -- 84 -- -- -- --  12/03/21 2328 129/85 -- 88 -- -- -- --  12/03/21 2301 131/89 -- -- -- -- -- --  12/03/21 2258 -- 99.7 F (37.6 C) 81 17 100 % 5\' 2"  (1.575 m) 80.7 kg   Constitutional: Well-developed, well-nourished female in no acute distress.  Cardiovascular: normal rate Respiratory: normal effort GI: Abd soft, non-tender, gravid appropriate for gestational age.  MS: Extremities nontender, no edema, normal ROM Neurologic: Alert and oriented x 4.  GU: Neg CVAT.  PELVIC EXAM: Cervix pink, visually closed, without lesion, scant white creamy discharge, vaginal walls and external  genitalia normal Bimanual exam: Cervix 0/long/high, firm, anterior, neg CMT, uterus nontender, nonenlarged, adnexa without tenderness, enlargement, or mass  Dilation: Closed Effacement (%): Thick Cervical Position: Posterior Exam by:: [email or web address] CNM  FHT:  Baseline 135 , moderate variability, accelerations present, no decelerations Contractions: q 2-3 mins, mild to palpation   Labs: Results for orders placed or performed during the hospital encounter of 12/03/21 (from the past 24 hour(s))  Urinalysis, Routine w reflex microscopic Urine, Clean Catch     Status: Abnormal   Collection Time: 12/03/21 11:15 PM  Result Value Ref Range   Color, Urine STRAW (A) YELLOW   APPearance HAZY (A) CLEAR   Specific Gravity, Urine 1.006 1.005 - 1.030   pH 6.0 5.0 - 8.0   Glucose, UA >=500 (A) NEGATIVE mg/dL   Hgb urine dipstick NEGATIVE NEGATIVE   Bilirubin Urine NEGATIVE NEGATIVE   Ketones, ur NEGATIVE NEGATIVE mg/dL   Protein, ur NEGATIVE NEGATIVE mg/dL   Nitrite NEGATIVE NEGATIVE   Leukocytes,Ua NEGATIVE NEGATIVE   RBC / HPF 0-5 0 - 5 RBC/hpf   WBC, UA 0-5 0 - 5 WBC/hpf   Bacteria, UA RARE (A) NONE SEEN   Squamous Epithelial / LPF 6-10 0 - 5  Protein / creatinine ratio, urine     Status: Abnormal   Collection Time: 12/03/21 11:15 PM  Result Value Ref Range   Creatinine, Urine 33.54 mg/dL   Total Protein, Urine 9 mg/dL   Protein Creatinine Ratio 0.27 (H) 0.00 - 0.15 mg/mg[Cre]  CBC     Status: Abnormal   Collection Time: 12/03/21 11:44 PM  Result Value Ref Range   WBC 6.1 4.0 - 10.5 K/uL   RBC 3.69 (L) 3.87 - 5.11 MIL/uL   Hemoglobin 10.1 (L) 12.0 - 15.0 g/dL   HCT 12/05/21 (L) 62.8 - 36.6 %   MCV 81.6 80.0 - 100.0 fL   MCH 27.4 26.0 - 34.0 pg   MCHC 33.6 30.0 - 36.0 g/dL   RDW 29.4 76.5 - 46.5 %   Platelets 271 150 - 400 K/uL   nRBC 0.0 0.0 - 0.2 %  Comprehensive metabolic panel     Status: Abnormal   Collection Time: 12/03/21 11:44 PM  Result Value Ref Range   Sodium 134  (L) 135 - 145 mmol/L   Potassium 3.7 3.5 - 5.1 mmol/L   Chloride 105 98 - 111 mmol/L   CO2 21 (L) 22 - 32 mmol/L   Glucose, Bld 173 (H) 70 - 99 mg/dL   BUN 9 6 - 20 mg/dL  Creatinine, Ser 0.67 0.44 - 1.00 mg/dL   Calcium 8.5 (L) 8.9 - 10.3 mg/dL   Total Protein 5.2 (L) 6.5 - 8.1 g/dL   Albumin 2.5 (L) 3.5 - 5.0 g/dL   AST 20 15 - 41 U/L   ALT 13 0 - 44 U/L   Alkaline Phosphatase 112 38 - 126 U/L   Total Bilirubin 0.4 0.3 - 1.2 mg/dL   GFR, Estimated >55 >37 mL/min   Anion gap 8 5 - 15      Imaging:  No results found.  MAU Course/MDM: Orders Placed This Encounter  Procedures   Urinalysis, Routine w reflex microscopic Urine, Clean Catch   CBC   Comprehensive metabolic panel   Protein / creatinine ratio, urine   Discharge patient    No orders of the defined types were placed in this encounter.    NST reviewed and reactive Pt without s/sx of PEC, PEC labs wnl, with P/C ratio of 0.27 No evidence of GHTN or PEC today Pt to continue home BPs daily and f/u with [facility] as scheduled Discussed pt elevated glucose and glucose control, pt to follow up with endocrinology for improved glucose management during and after pregnancy Return precautions reviewed   Assessment: 1. Edema during pregnancy in third trimester   2. Type 1 diabetes mellitus affecting pregnancy in third trimester, antepartum   3. [redacted] weeks gestation of pregnancy     Plan: Discharge home Labor precautions and fetal kick counts  Follow-up Information     [facility] Follow up.   Why: As scheduled Contact information: [address] [place] [address] [phone]         [facility] 1S Maternity Assessment Unit Follow up.   Specialty: Obstetrics and Gynecology Why: As needed for emergencies Contact information: [id number] Wilhemina Bonito [place] [address] [phone]               Allergies as of 12/04/2021   No Known Allergies      Medication  List     TAKE these medications    cephALEXin 500 MG capsule Commonly known as: KEFLEX Take 1 capsule (500 mg total) by mouth 4 (four) times daily.   insulin lispro 100 UNIT/ML injection Commonly known as: HUMALOG Inject into the skin 3 (three) times daily before meals.   prenatal vitamin w/FE, FA 27-1 MG Tabs tablet Take 1 tablet by mouth daily at 12 noon.   Toujeo Max SoloStar 300 UNIT/ML Solostar Pen Generic drug: insulin glargine (2 Unit Dial) Inject 42 Units into the skin at bedtime.        Sharen Counter Certified Nurse-Midwife 12/04/2021 12:51 AM

## 2021-12-03 NOTE — MAU Note (Signed)
.  Angela Meyer is a 26 y.o. at [redacted]w[redacted]d here in MAU reporting high b/p tonight. It was 154/103 and feet are very swollen. No hx of HTN. Pt is type 1 Diabetic on Insulin. Pt reports abdominal cramping for couple wks and states that is nothing new. Denies visual changes or h/a. Good FM. Denies VB or LOF  Onset of complaint: tonight Pain score: 6 Vitals:   12/03/21 2258 12/03/21 2301  BP:  131/89  Pulse: 81   Resp: 17   Temp: 99.7 F (37.6 C)   SpO2: 100%      FHT:145 Lab orders placed from triage:  u/a

## 2021-12-04 DIAGNOSIS — O1203 Gestational edema, third trimester: Secondary | ICD-10-CM

## 2021-12-04 DIAGNOSIS — Z3A32 32 weeks gestation of pregnancy: Secondary | ICD-10-CM

## 2021-12-04 LAB — CBC
HCT: 30.1 % — ABNORMAL LOW (ref 36.0–46.0)
Hemoglobin: 10.1 g/dL — ABNORMAL LOW (ref 12.0–15.0)
MCH: 27.4 pg (ref 26.0–34.0)
MCHC: 33.6 g/dL (ref 30.0–36.0)
MCV: 81.6 fL (ref 80.0–100.0)
Platelets: 271 10*3/uL (ref 150–400)
RBC: 3.69 MIL/uL — ABNORMAL LOW (ref 3.87–5.11)
RDW: 14 % (ref 11.5–15.5)
WBC: 6.1 10*3/uL (ref 4.0–10.5)
nRBC: 0 % (ref 0.0–0.2)

## 2021-12-04 LAB — COMPREHENSIVE METABOLIC PANEL
ALT: 13 U/L (ref 0–44)
AST: 20 U/L (ref 15–41)
Albumin: 2.5 g/dL — ABNORMAL LOW (ref 3.5–5.0)
Alkaline Phosphatase: 112 U/L (ref 38–126)
Anion gap: 8 (ref 5–15)
BUN: 9 mg/dL (ref 6–20)
CO2: 21 mmol/L — ABNORMAL LOW (ref 22–32)
Calcium: 8.5 mg/dL — ABNORMAL LOW (ref 8.9–10.3)
Chloride: 105 mmol/L (ref 98–111)
Creatinine, Ser: 0.67 mg/dL (ref 0.44–1.00)
GFR, Estimated: >60 mL/min (ref >60)
Glucose, Bld: 173 mg/dL — ABNORMAL HIGH (ref 70–99)
Potassium: 3.7 mmol/L (ref 3.5–5.1)
Sodium: 134 mmol/L — ABNORMAL LOW (ref 135–145)
Total Bilirubin: 0.4 mg/dL (ref 0.3–1.2)
Total Protein: 5.2 g/dL — ABNORMAL LOW (ref 6.5–8.1)

## 2021-12-04 LAB — PROTEIN / CREATININE RATIO, URINE
Creatinine, Urine: 33.54 mg/dL
Protein Creatinine Ratio: 0.27 mg/mg{Cre} — ABNORMAL HIGH (ref 0.00–0.15)
Total Protein, Urine: 9 mg/dL

## 2021-12-04 NOTE — MAU Note (Signed)
OK to d/c EFM per Sharen Counter CNM as pt aked how long she had to be on EFM. Also cycling b/p discontinued.

## 2021-12-04 NOTE — Progress Notes (Signed)
Lisa Leftwich Kirby CNM in earlier to discuss lab results and d/c plan. Written and verbal d/c instructions given and understanding voiced. 

## 2021-12-13 ENCOUNTER — Other Ambulatory Visit: Payer: Self-pay

## 2021-12-13 ENCOUNTER — Encounter (HOSPITAL_COMMUNITY): Payer: Self-pay | Admitting: Obstetrics and Gynecology

## 2021-12-13 ENCOUNTER — Observation Stay (HOSPITAL_COMMUNITY)
Admission: AD | Admit: 2021-12-13 | Discharge: 2021-12-14 | Disposition: A | Discharge status: Home / Self Care | Payer: 59 | Attending: Obstetrics and Gynecology | Admitting: Obstetrics and Gynecology

## 2021-12-13 DIAGNOSIS — O99283 Endocrine, nutritional and metabolic diseases complicating pregnancy, third trimester: Secondary | ICD-10-CM | POA: Insufficient documentation

## 2021-12-13 DIAGNOSIS — O133 Gestational [pregnancy-induced] hypertension without significant proteinuria, third trimester: Principal | ICD-10-CM | POA: Insufficient documentation

## 2021-12-13 DIAGNOSIS — E039 Hypothyroidism, unspecified: Secondary | ICD-10-CM | POA: Insufficient documentation

## 2021-12-13 DIAGNOSIS — Z3A33 33 weeks gestation of pregnancy: Secondary | ICD-10-CM | POA: Insufficient documentation

## 2021-12-13 DIAGNOSIS — O3663X0 Maternal care for excessive fetal growth, third trimester, not applicable or unspecified: Secondary | ICD-10-CM | POA: Diagnosis present

## 2021-12-13 DIAGNOSIS — E109 Type 1 diabetes mellitus without complications: Secondary | ICD-10-CM | POA: Diagnosis not present

## 2021-12-13 DIAGNOSIS — O24013 Pre-existing diabetes mellitus, type 1, in pregnancy, third trimester: Secondary | ICD-10-CM | POA: Insufficient documentation

## 2021-12-13 DIAGNOSIS — E0590 Thyrotoxicosis, unspecified without thyrotoxic crisis or storm: Secondary | ICD-10-CM | POA: Diagnosis present

## 2021-12-13 DIAGNOSIS — E1065 Type 1 diabetes mellitus with hyperglycemia: Secondary | ICD-10-CM | POA: Diagnosis present

## 2021-12-13 DIAGNOSIS — Z794 Long term (current) use of insulin: Secondary | ICD-10-CM | POA: Diagnosis not present

## 2021-12-13 DIAGNOSIS — O139 Gestational [pregnancy-induced] hypertension without significant proteinuria, unspecified trimester: Secondary | ICD-10-CM | POA: Diagnosis present

## 2021-12-13 LAB — COMPREHENSIVE METABOLIC PANEL
ALT: 15 U/L (ref 0–44)
AST: 20 U/L (ref 15–41)
Albumin: 2.6 g/dL — ABNORMAL LOW (ref 3.5–5.0)
Alkaline Phosphatase: 146 U/L — ABNORMAL HIGH (ref 38–126)
Anion gap: 8 (ref 5–15)
BUN: <5 mg/dL — ABNORMAL LOW (ref 6–20)
CO2: 22 mmol/L (ref 22–32)
Calcium: 8.6 mg/dL — ABNORMAL LOW (ref 8.9–10.3)
Chloride: 105 mmol/L (ref 98–111)
Creatinine, Ser: 0.72 mg/dL (ref 0.44–1.00)
GFR, Estimated: >60 mL/min (ref >60)
Glucose, Bld: 264 mg/dL — ABNORMAL HIGH (ref 70–99)
Potassium: 3.9 mmol/L (ref 3.5–5.1)
Sodium: 135 mmol/L (ref 135–145)
Total Bilirubin: 0.4 mg/dL (ref 0.3–1.2)
Total Protein: 5.5 g/dL — ABNORMAL LOW (ref 6.5–8.1)

## 2021-12-13 LAB — CBC
HCT: 31.9 % — ABNORMAL LOW (ref 36.0–46.0)
Hemoglobin: 10.4 g/dL — ABNORMAL LOW (ref 12.0–15.0)
MCH: 26.1 pg (ref 26.0–34.0)
MCHC: 32.6 g/dL (ref 30.0–36.0)
MCV: 80.2 fL (ref 80.0–100.0)
Platelets: 274 10*3/uL (ref 150–400)
RBC: 3.98 MIL/uL (ref 3.87–5.11)
RDW: 14.1 % (ref 11.5–15.5)
WBC: 6 10*3/uL (ref 4.0–10.5)
nRBC: 0 % (ref 0.0–0.2)

## 2021-12-13 LAB — GLUCOSE, CAPILLARY
Glucose-Capillary: 195 mg/dL — ABNORMAL HIGH (ref 70–99)
Glucose-Capillary: 174 mg/dL — ABNORMAL HIGH (ref 70–99)

## 2021-12-13 LAB — TYPE AND SCREEN
ABO/RH(D): O POS
Antibody Screen: NEGATIVE

## 2021-12-13 LAB — HEMOGLOBIN A1C
Hgb A1c MFr Bld: 8.5 % — ABNORMAL HIGH (ref 4.8–5.6)
Mean Plasma Glucose: 197.25 mg/dL

## 2021-12-13 MED ORDER — ACETAMINOPHEN 325 MG PO TABS: 650.0000 mg | ORAL_TABLET | ORAL | Status: DC | PRN

## 2021-12-13 MED ORDER — PRENATAL MULTIVITAMIN CH
1.0000 | ORAL_TABLET | Freq: Every day | ORAL | Status: DC
  Administered 2021-12-14: 1 via ORAL
  Filled 2021-12-13 (×2): qty 1

## 2021-12-13 MED ORDER — DOCUSATE SODIUM 100 MG PO CAPS
100.0000 mg | ORAL_CAPSULE | Freq: Every day | ORAL | Status: DC
  Administered 2021-12-14: 100 mg via ORAL
  Filled 2021-12-13 (×2): qty 1

## 2021-12-13 MED ORDER — LABETALOL HCL 100 MG PO TABS
100.0000 mg | ORAL_TABLET | Freq: Two times a day (BID) | ORAL | Status: DC
  Administered 2021-12-13 – 2021-12-14 (×2): 100 mg via ORAL
  Filled 2021-12-13 (×2): qty 1

## 2021-12-13 MED ORDER — CALCIUM CARBONATE ANTACID 500 MG PO CHEW
2.0000 | CHEWABLE_TABLET | ORAL | Status: DC | PRN
  Administered 2021-12-14: 400 mg via ORAL
  Filled 2021-12-13: qty 2

## 2021-12-13 MED ORDER — INSULIN ASPART 100 UNIT/ML IJ SOLN: 18.0000 [IU] | Freq: Three times a day (TID) | INTRAMUSCULAR | Status: DC

## 2021-12-13 MED ORDER — INSULIN GLARGINE-YFGN 100 UNIT/ML ~~LOC~~ SOLN
42.0000 [IU] | Freq: Every day | SUBCUTANEOUS | Status: DC
  Administered 2021-12-13: 42 [IU] via SUBCUTANEOUS
  Filled 2021-12-13 (×2): qty 0.42

## 2021-12-13 MED ORDER — INSULIN ASPART 100 UNIT/ML IJ SOLN
3.0000 [IU] | Freq: Three times a day (TID) | INTRAMUSCULAR | Status: DC
Start: 2021-12-13 — End: 2021-12-13

## 2021-12-13 MED ORDER — ZOLPIDEM TARTRATE 5 MG PO TABS: 5.0000 mg | ORAL_TABLET | Freq: Every evening | ORAL | Status: DC | PRN

## 2021-12-13 MED ORDER — INSULIN ASPART 100 UNIT/ML IJ SOLN
18.0000 [IU] | Freq: Three times a day (TID) | INTRAMUSCULAR | Status: DC
  Administered 2021-12-13: 24 [IU] via SUBCUTANEOUS
  Administered 2021-12-13: 19 [IU] via SUBCUTANEOUS
  Administered 2021-12-14: 18 [IU] via SUBCUTANEOUS
  Administered 2021-12-14: 15 [IU] via SUBCUTANEOUS

## 2021-12-14 ENCOUNTER — Inpatient Hospital Stay (HOSPITAL_BASED_OUTPATIENT_CLINIC_OR_DEPARTMENT_OTHER): Payer: 59

## 2021-12-14 DIAGNOSIS — O24013 Pre-existing diabetes mellitus, type 1, in pregnancy, third trimester: Secondary | ICD-10-CM | POA: Diagnosis not present

## 2021-12-14 DIAGNOSIS — Z794 Long term (current) use of insulin: Secondary | ICD-10-CM

## 2021-12-14 DIAGNOSIS — O133 Gestational [pregnancy-induced] hypertension without significant proteinuria, third trimester: Secondary | ICD-10-CM | POA: Diagnosis not present

## 2021-12-14 DIAGNOSIS — Z3A33 33 weeks gestation of pregnancy: Secondary | ICD-10-CM | POA: Diagnosis not present

## 2021-12-14 LAB — PROTEIN, URINE, 24 HOUR
Collection Interval-UPROT: 24 hours
Protein, 24H Urine: 1296 mg/d — ABNORMAL HIGH (ref 50–100)
Protein, Urine: 48 mg/dL
Urine Total Volume-UPROT: 2700 mL

## 2021-12-14 LAB — GLUCOSE, CAPILLARY
Glucose-Capillary: 133 mg/dL — ABNORMAL HIGH (ref 70–99)
Glucose-Capillary: 137 mg/dL — ABNORMAL HIGH (ref 70–99)
Glucose-Capillary: 140 mg/dL — ABNORMAL HIGH (ref 70–99)
Glucose-Capillary: 66 mg/dL — ABNORMAL LOW (ref 70–99)
Glucose-Capillary: 72 mg/dL (ref 70–99)
Glucose-Capillary: 84 mg/dL (ref 70–99)

## 2021-12-14 MED ORDER — LABETALOL HCL 100 MG PO TABS: 100.0000 mg | ORAL_TABLET | Freq: Two times a day (BID) | ORAL | Status: DC

## 2021-12-16 ENCOUNTER — Inpatient Hospital Stay (HOSPITAL_COMMUNITY): Payer: 59 | Admitting: Anesthesiology

## 2021-12-16 ENCOUNTER — Other Ambulatory Visit: Payer: Self-pay

## 2021-12-16 ENCOUNTER — Encounter (HOSPITAL_COMMUNITY): Payer: Self-pay | Admitting: Obstetrics and Gynecology

## 2021-12-16 ENCOUNTER — Encounter (HOSPITAL_COMMUNITY)
Admission: AD | Disposition: A | Discharge status: Home / Self Care | Payer: Self-pay | Source: Home / Self Care | Attending: Obstetrics and Gynecology

## 2021-12-16 ENCOUNTER — Inpatient Hospital Stay (HOSPITAL_COMMUNITY)
Admission: AD | Admit: 2021-12-16 | Discharge: 2021-12-21 | DRG: 788 | Disposition: A | Discharge status: Home / Self Care | Payer: 59 | Attending: Obstetrics and Gynecology | Admitting: Obstetrics and Gynecology

## 2021-12-16 DIAGNOSIS — O2402 Pre-existing diabetes mellitus, type 1, in childbirth: Secondary | ICD-10-CM | POA: Diagnosis present

## 2021-12-16 DIAGNOSIS — E0500 Thyrotoxicosis with diffuse goiter without thyrotoxic crisis or storm: Secondary | ICD-10-CM | POA: Diagnosis present

## 2021-12-16 DIAGNOSIS — O133 Gestational [pregnancy-induced] hypertension without significant proteinuria, third trimester: Secondary | ICD-10-CM

## 2021-12-16 DIAGNOSIS — O1410 Severe pre-eclampsia, unspecified trimester: Secondary | ICD-10-CM | POA: Diagnosis present

## 2021-12-16 DIAGNOSIS — O1414 Severe pre-eclampsia complicating childbirth: Secondary | ICD-10-CM | POA: Diagnosis present

## 2021-12-16 DIAGNOSIS — O9902 Anemia complicating childbirth: Secondary | ICD-10-CM | POA: Diagnosis present

## 2021-12-16 DIAGNOSIS — Z794 Long term (current) use of insulin: Secondary | ICD-10-CM

## 2021-12-16 DIAGNOSIS — E1065 Type 1 diabetes mellitus with hyperglycemia: Secondary | ICD-10-CM | POA: Diagnosis present

## 2021-12-16 DIAGNOSIS — O99284 Endocrine, nutritional and metabolic diseases complicating childbirth: Secondary | ICD-10-CM | POA: Diagnosis present

## 2021-12-16 DIAGNOSIS — Z3A34 34 weeks gestation of pregnancy: Secondary | ICD-10-CM

## 2021-12-16 DIAGNOSIS — O139 Gestational [pregnancy-induced] hypertension without significant proteinuria, unspecified trimester: Secondary | ICD-10-CM | POA: Diagnosis present

## 2021-12-16 DIAGNOSIS — E109 Type 1 diabetes mellitus without complications: Secondary | ICD-10-CM | POA: Diagnosis present

## 2021-12-16 DIAGNOSIS — Z98891 History of uterine scar from previous surgery: Secondary | ICD-10-CM

## 2021-12-16 DIAGNOSIS — O24013 Pre-existing diabetes mellitus, type 1, in pregnancy, third trimester: Secondary | ICD-10-CM

## 2021-12-16 DIAGNOSIS — E0590 Thyrotoxicosis, unspecified without thyrotoxic crisis or storm: Secondary | ICD-10-CM | POA: Diagnosis present

## 2021-12-16 DIAGNOSIS — D509 Iron deficiency anemia, unspecified: Secondary | ICD-10-CM | POA: Diagnosis present

## 2021-12-16 LAB — GLUCOSE, CAPILLARY
Glucose-Capillary: 180 mg/dL — ABNORMAL HIGH (ref 70–99)
Glucose-Capillary: 87 mg/dL (ref 70–99)
Glucose-Capillary: 66 mg/dL — ABNORMAL LOW (ref 70–99)

## 2021-12-16 LAB — COMPREHENSIVE METABOLIC PANEL
ALT: 17 U/L (ref 0–44)
AST: 22 U/L (ref 15–41)
Albumin: 2.5 g/dL — ABNORMAL LOW (ref 3.5–5.0)
Alkaline Phosphatase: 153 U/L — ABNORMAL HIGH (ref 38–126)
Anion gap: 5 (ref 5–15)
BUN: 6 mg/dL (ref 6–20)
CO2: 21 mmol/L — ABNORMAL LOW (ref 22–32)
Calcium: 8.5 mg/dL — ABNORMAL LOW (ref 8.9–10.3)
Chloride: 108 mmol/L (ref 98–111)
Creatinine, Ser: 0.65 mg/dL (ref 0.44–1.00)
GFR, Estimated: >60 mL/min (ref >60)
Glucose, Bld: 174 mg/dL — ABNORMAL HIGH (ref 70–99)
Potassium: 3.7 mmol/L (ref 3.5–5.1)
Sodium: 134 mmol/L — ABNORMAL LOW (ref 135–145)
Total Bilirubin: 0.4 mg/dL (ref 0.3–1.2)
Total Protein: 5.4 g/dL — ABNORMAL LOW (ref 6.5–8.1)

## 2021-12-16 LAB — CBC
HCT: 32.5 % — ABNORMAL LOW (ref 36.0–46.0)
Hemoglobin: 10.6 g/dL — ABNORMAL LOW (ref 12.0–15.0)
MCH: 26 pg (ref 26.0–34.0)
MCHC: 32.6 g/dL (ref 30.0–36.0)
MCV: 79.9 fL — ABNORMAL LOW (ref 80.0–100.0)
Platelets: 276 10*3/uL (ref 150–400)
RBC: 4.07 MIL/uL (ref 3.87–5.11)
RDW: 14.3 % (ref 11.5–15.5)
WBC: 5.8 10*3/uL (ref 4.0–10.5)
nRBC: 0 % (ref 0.0–0.2)

## 2021-12-16 LAB — TYPE AND SCREEN
ABO/RH(D): O POS
Antibody Screen: NEGATIVE

## 2021-12-16 SURGERY — Surgical Case
Anesthesia: Spinal

## 2021-12-16 MED ORDER — COCONUT OIL OIL
1.0000 | TOPICAL_OIL | Status: DC | PRN
Start: 2021-12-16 — End: 2021-12-21
  Administered 2021-12-17: 1 via TOPICAL

## 2021-12-16 MED ORDER — FENTANYL CITRATE (PF) 100 MCG/2ML IJ SOLN
INTRAMUSCULAR | Status: AC
  Filled 2021-12-16: qty 2

## 2021-12-16 MED ORDER — ONDANSETRON HCL 4 MG/2ML IJ SOLN
INTRAMUSCULAR | Status: AC
  Filled 2021-12-16: qty 2

## 2021-12-16 MED ORDER — CEFAZOLIN SODIUM-DEXTROSE 2-4 GM/100ML-% IV SOLN
INTRAVENOUS | Status: AC
  Filled 2021-12-16: qty 100

## 2021-12-16 MED ORDER — DIBUCAINE (PERIANAL) 1 % EX OINT: 1.0000 | TOPICAL_OINTMENT | CUTANEOUS | Status: DC | PRN

## 2021-12-16 MED ORDER — OXYCODONE HCL 5 MG/5ML PO SOLN: 5.0000 mg | Freq: Once | ORAL | Status: DC | PRN

## 2021-12-16 MED ORDER — LACTATED RINGERS IV SOLN: INTRAVENOUS | Status: DC | PRN

## 2021-12-16 MED ORDER — SODIUM CHLORIDE 0.9% FLUSH: 3.0000 mL | INTRAVENOUS | Status: DC | PRN

## 2021-12-16 MED ORDER — FENTANYL CITRATE (PF) 100 MCG/2ML IJ SOLN: 25.0000 ug | INTRAMUSCULAR | Status: DC | PRN

## 2021-12-16 MED ORDER — NALOXONE HCL 4 MG/10ML IJ SOLN: 1.0000 ug/kg/h | INTRAVENOUS | Status: DC | PRN

## 2021-12-16 MED ORDER — PRENATAL MULTIVITAMIN CH
1.0000 | ORAL_TABLET | Freq: Every day | ORAL | Status: DC
  Administered 2021-12-17 – 2021-12-21 (×5): 1 via ORAL
  Filled 2021-12-16 (×5): qty 1

## 2021-12-16 MED ORDER — MAGNESIUM SULFATE BOLUS VIA INFUSION
4.0000 g | Freq: Once | INTRAVENOUS | Status: AC
  Administered 2021-12-16: 4 g via INTRAVENOUS
  Filled 2021-12-16: qty 1000

## 2021-12-16 MED ORDER — MEPERIDINE HCL 25 MG/ML IJ SOLN: 6.2500 mg | INTRAMUSCULAR | Status: DC | PRN

## 2021-12-16 MED ORDER — CEFAZOLIN SODIUM-DEXTROSE 2-3 GM-%(50ML) IV SOLR
INTRAVENOUS | Status: DC | PRN
  Administered 2021-12-16: 2 g via INTRAVENOUS

## 2021-12-16 MED ORDER — ZOLPIDEM TARTRATE 5 MG PO TABS: 5.0000 mg | ORAL_TABLET | Freq: Every evening | ORAL | Status: DC | PRN

## 2021-12-16 MED ORDER — INSULIN NPH (HUMAN) (ISOPHANE) 100 UNIT/ML ~~LOC~~ SUSP: 42.0000 [IU] | Freq: Every day | SUBCUTANEOUS | Status: DC

## 2021-12-16 MED ORDER — LABETALOL HCL 5 MG/ML IV SOLN: 80.0000 mg | INTRAVENOUS | Status: DC | PRN

## 2021-12-16 MED ORDER — MENTHOL 3 MG MT LOZG
1.0000 | LOZENGE | OROMUCOSAL | Status: DC | PRN
Start: 2021-12-16 — End: 2021-12-21

## 2021-12-16 MED ORDER — OXYCODONE HCL 5 MG PO TABS
5.0000 mg | ORAL_TABLET | ORAL | Status: DC | PRN
  Administered 2021-12-17 – 2021-12-18 (×3): 5 mg via ORAL
  Administered 2021-12-18 (×3): 10 mg via ORAL
  Administered 2021-12-19 – 2021-12-20 (×2): 5 mg via ORAL
  Filled 2021-12-16: qty 1
  Filled 2021-12-16: qty 2
  Filled 2021-12-16 (×2): qty 1
  Filled 2021-12-16: qty 2
  Filled 2021-12-16: qty 1
  Filled 2021-12-16: qty 2
  Filled 2021-12-16: qty 1

## 2021-12-16 MED ORDER — BUPIVACAINE IN DEXTROSE 0.75-8.25 % IT SOLN
INTRATHECAL | Status: DC | PRN
  Administered 2021-12-16: 1.6 mL via INTRATHECAL

## 2021-12-16 MED ORDER — OXYTOCIN-SODIUM CHLORIDE 30-0.9 UT/500ML-% IV SOLN
INTRAVENOUS | Status: AC
  Filled 2021-12-16: qty 500

## 2021-12-16 MED ORDER — KETOROLAC TROMETHAMINE 30 MG/ML IJ SOLN
30.0000 mg | Freq: Four times a day (QID) | INTRAMUSCULAR | Status: AC
  Administered 2021-12-16 – 2021-12-17 (×4): 30 mg via INTRAVENOUS
  Filled 2021-12-16 (×4): qty 1

## 2021-12-16 MED ORDER — ONDANSETRON HCL 4 MG/2ML IJ SOLN
INTRAMUSCULAR | Status: DC | PRN
  Administered 2021-12-16: 4 mg via INTRAVENOUS

## 2021-12-16 MED ORDER — KETOROLAC TROMETHAMINE 30 MG/ML IJ SOLN
INTRAMUSCULAR | Status: AC
  Filled 2021-12-16: qty 1

## 2021-12-16 MED ORDER — WITCH HAZEL-GLYCERIN EX PADS: 1.0000 | MEDICATED_PAD | CUTANEOUS | Status: DC | PRN

## 2021-12-16 MED ORDER — OXYTOCIN-SODIUM CHLORIDE 30-0.9 UT/500ML-% IV SOLN
INTRAVENOUS | Status: DC | PRN
  Administered 2021-12-16: 300 mL via INTRAVENOUS

## 2021-12-16 MED ORDER — LABETALOL HCL 5 MG/ML IV SOLN
40.0000 mg | INTRAVENOUS | Status: DC | PRN
  Filled 2021-12-16: qty 8

## 2021-12-16 MED ORDER — ONDANSETRON HCL 4 MG/2ML IJ SOLN: 4.0000 mg | Freq: Four times a day (QID) | INTRAMUSCULAR | Status: DC | PRN

## 2021-12-16 MED ORDER — SIMETHICONE 80 MG PO CHEW
80.0000 mg | CHEWABLE_TABLET | Freq: Three times a day (TID) | ORAL | Status: DC
  Administered 2021-12-17 – 2021-12-21 (×12): 80 mg via ORAL
  Filled 2021-12-16 (×12): qty 1

## 2021-12-16 MED ORDER — INSULIN GLARGINE-YFGN 100 UNIT/ML ~~LOC~~ SOLN
21.0000 [IU] | Freq: Every day | SUBCUTANEOUS | Status: DC
  Administered 2021-12-16 – 2021-12-20 (×5): 21 [IU] via SUBCUTANEOUS
  Filled 2021-12-16 (×6): qty 0.21

## 2021-12-16 MED ORDER — MORPHINE SULFATE (PF) 0.5 MG/ML IJ SOLN
INTRAMUSCULAR | Status: AC
  Filled 2021-12-16: qty 10

## 2021-12-16 MED ORDER — IBUPROFEN 600 MG PO TABS
600.0000 mg | ORAL_TABLET | Freq: Four times a day (QID) | ORAL | Status: DC
  Administered 2021-12-17 – 2021-12-21 (×15): 600 mg via ORAL
  Filled 2021-12-16 (×17): qty 1

## 2021-12-16 MED ORDER — INSULIN ASPART 100 UNIT/ML IJ SOLN
0.0000 [IU] | Freq: Every day | INTRAMUSCULAR | Status: DC
  Administered 2021-12-17: 4 [IU] via SUBCUTANEOUS
  Administered 2021-12-18: 2 [IU] via SUBCUTANEOUS

## 2021-12-16 MED ORDER — DIPHENHYDRAMINE HCL 25 MG PO CAPS: 25.0000 mg | ORAL_CAPSULE | ORAL | Status: DC | PRN

## 2021-12-16 MED ORDER — LABETALOL HCL 5 MG/ML IV SOLN
20.0000 mg | INTRAVENOUS | Status: DC | PRN
  Administered 2021-12-17 – 2021-12-18 (×2): 20 mg via INTRAVENOUS
  Filled 2021-12-16 (×3): qty 4

## 2021-12-16 MED ORDER — MAGNESIUM SULFATE 40 GM/1000ML IV SOLN
2.0000 g/h | INTRAVENOUS | Status: AC
  Administered 2021-12-16 – 2021-12-17 (×2): 2 g/h via INTRAVENOUS
  Filled 2021-12-16 (×2): qty 1000

## 2021-12-16 MED ORDER — OXYCODONE HCL 5 MG PO TABS: 5.0000 mg | ORAL_TABLET | Freq: Once | ORAL | Status: DC | PRN

## 2021-12-16 MED ORDER — OXYTOCIN-SODIUM CHLORIDE 30-0.9 UT/500ML-% IV SOLN: 2.5000 [IU]/h | INTRAVENOUS | Status: AC

## 2021-12-16 MED ORDER — INSULIN ASPART 100 UNIT/ML IJ SOLN
4.0000 [IU] | Freq: Three times a day (TID) | INTRAMUSCULAR | Status: DC
  Administered 2021-12-17 – 2021-12-21 (×10): 4 [IU] via SUBCUTANEOUS

## 2021-12-16 MED ORDER — KETOROLAC TROMETHAMINE 30 MG/ML IJ SOLN: 30.0000 mg | Freq: Four times a day (QID) | INTRAMUSCULAR | Status: DC | PRN

## 2021-12-16 MED ORDER — TETANUS-DIPHTH-ACELL PERTUSSIS 5-2.5-18.5 LF-MCG/0.5 IM SUSY: 0.5000 mL | PREFILLED_SYRINGE | Freq: Once | INTRAMUSCULAR | Status: DC

## 2021-12-16 MED ORDER — INSULIN ASPART 100 UNIT/ML IJ SOLN
0.0000 [IU] | Freq: Three times a day (TID) | INTRAMUSCULAR | Status: DC
  Administered 2021-12-17: 3 [IU] via SUBCUTANEOUS
  Administered 2021-12-17: 5 [IU] via SUBCUTANEOUS
  Administered 2021-12-17 – 2021-12-18 (×2): 3 [IU] via SUBCUTANEOUS
  Administered 2021-12-18: 5 [IU] via SUBCUTANEOUS
  Administered 2021-12-19: 3 [IU] via SUBCUTANEOUS
  Administered 2021-12-19: 8 [IU] via SUBCUTANEOUS
  Administered 2021-12-20 (×3): 3 [IU] via SUBCUTANEOUS
  Administered 2021-12-21: 2 [IU] via SUBCUTANEOUS

## 2021-12-16 MED ORDER — ACETAMINOPHEN 500 MG PO TABS
1000.0000 mg | ORAL_TABLET | Freq: Four times a day (QID) | ORAL | Status: DC
  Administered 2021-12-16 – 2021-12-21 (×17): 1000 mg via ORAL
  Filled 2021-12-16 (×19): qty 2

## 2021-12-16 MED ORDER — PHENYLEPHRINE HCL-NACL 20-0.9 MG/250ML-% IV SOLN
INTRAVENOUS | Status: DC | PRN
  Administered 2021-12-16: 60 ug/min via INTRAVENOUS

## 2021-12-16 MED ORDER — HYDRALAZINE HCL 20 MG/ML IJ SOLN: 10.0000 mg | INTRAMUSCULAR | Status: DC | PRN

## 2021-12-16 MED ORDER — SCOPOLAMINE 1 MG/3DAYS TD PT72
MEDICATED_PATCH | TRANSDERMAL | Status: AC
  Filled 2021-12-16: qty 1

## 2021-12-16 MED ORDER — SCOPOLAMINE 1 MG/3DAYS TD PT72
1.0000 | MEDICATED_PATCH | Freq: Once | TRANSDERMAL | Status: DC
  Administered 2021-12-16: 1.5 mg via TRANSDERMAL

## 2021-12-16 MED ORDER — DIPHENHYDRAMINE HCL 25 MG PO CAPS: 25.0000 mg | ORAL_CAPSULE | Freq: Four times a day (QID) | ORAL | Status: DC | PRN

## 2021-12-16 MED ORDER — HYDROMORPHONE HCL 1 MG/ML IJ SOLN: 0.2000 mg | INTRAMUSCULAR | Status: DC | PRN

## 2021-12-16 MED ORDER — SIMETHICONE 80 MG PO CHEW: 80.0000 mg | CHEWABLE_TABLET | ORAL | Status: DC | PRN

## 2021-12-16 MED ORDER — MORPHINE SULFATE (PF) 0.5 MG/ML IJ SOLN
INTRAMUSCULAR | Status: DC | PRN
  Administered 2021-12-16: 150 ug via INTRATHECAL

## 2021-12-16 MED ORDER — SENNOSIDES-DOCUSATE SODIUM 8.6-50 MG PO TABS
2.0000 | ORAL_TABLET | Freq: Every day | ORAL | Status: DC
  Administered 2021-12-17 – 2021-12-21 (×5): 2 via ORAL
  Filled 2021-12-16 (×5): qty 2

## 2021-12-16 MED ORDER — NALOXONE HCL 0.4 MG/ML IJ SOLN: 0.4000 mg | INTRAMUSCULAR | Status: DC | PRN

## 2021-12-16 MED ORDER — SODIUM CHLORIDE 0.9 % IV SOLN
INTRAVENOUS | Status: DC
Start: 2021-12-16 — End: 2021-12-21

## 2021-12-16 MED ORDER — ONDANSETRON HCL 4 MG/2ML IJ SOLN
4.0000 mg | Freq: Three times a day (TID) | INTRAMUSCULAR | Status: DC | PRN
  Administered 2021-12-16: 4 mg via INTRAVENOUS
  Filled 2021-12-16: qty 2

## 2021-12-16 MED ORDER — DIPHENHYDRAMINE HCL 50 MG/ML IJ SOLN
12.5000 mg | INTRAMUSCULAR | Status: DC | PRN
  Administered 2021-12-17: 12.5 mg via INTRAVENOUS
  Filled 2021-12-16: qty 1

## 2021-12-16 MED ORDER — FENTANYL CITRATE (PF) 100 MCG/2ML IJ SOLN
INTRAMUSCULAR | Status: DC | PRN
  Administered 2021-12-16: 15 ug via INTRATHECAL

## 2021-12-16 MED ORDER — ACETAMINOPHEN 10 MG/ML IV SOLN
INTRAVENOUS | Status: AC
  Filled 2021-12-16: qty 100

## 2021-12-16 MED ORDER — PHENYLEPHRINE HCL-NACL 20-0.9 MG/250ML-% IV SOLN
INTRAVENOUS | Status: AC
  Filled 2021-12-16: qty 250

## 2021-12-16 SURGICAL SUPPLY — 32 items
APL SKNCLS STERI-STRIP NONHPOA (GAUZE/BANDAGES/DRESSINGS) ×1
BENZOIN TINCTURE PRP APPL 2/3 (GAUZE/BANDAGES/DRESSINGS) ×1 IMPLANT
CHLORAPREP W/TINT 26ML (MISCELLANEOUS) ×6 IMPLANT
CLAMP CORD UMBIL (MISCELLANEOUS) ×3 IMPLANT
CLOTH BEACON ORANGE TIMEOUT ST (SAFETY) ×3 IMPLANT
DRSG OPSITE POSTOP 4X10 (GAUZE/BANDAGES/DRESSINGS) ×3 IMPLANT
ELECT REM PT RETURN 9FT ADLT (ELECTROSURGICAL) ×2
ELECTRODE REM PT RTRN 9FT ADLT (ELECTROSURGICAL) ×2 IMPLANT
EXTRACTOR VACUUM KIWI (MISCELLANEOUS) IMPLANT
GLOVE BIOGEL PI IND STRL 7.0 (GLOVE) ×6 IMPLANT
GLOVE BIOGEL PI INDICATOR 7.0 (GLOVE) ×3
GLOVE ECLIPSE 6.5 STRL STRAW (GLOVE) ×3 IMPLANT
GOWN STRL REUS W/TWL LRG LVL3 (GOWN DISPOSABLE) ×6 IMPLANT
KIT ABG SYR 3ML LUER SLIP (SYRINGE) IMPLANT
LIGASURE IMPACT 36 18CM CVD LR (INSTRUMENTS) ×3 IMPLANT
NDL HYPO 25X5/8 SAFETYGLIDE (NEEDLE) IMPLANT
NEEDLE HYPO 25X5/8 SAFETYGLIDE (NEEDLE) IMPLANT
NS IRRIG 1000ML POUR BTL (IV SOLUTION) ×3 IMPLANT
PACK C SECTION WH (CUSTOM PROCEDURE TRAY) ×3 IMPLANT
PAD OB MATERNITY 4.3X12.25 (PERSONAL CARE ITEMS) ×3 IMPLANT
STRIP CLOSURE SKIN 1/2X4 (GAUZE/BANDAGES/DRESSINGS) ×1 IMPLANT
SUT MNCRL 0 VIOLET CTX 36 (SUTURE) ×4 IMPLANT
SUT MONOCRYL 0 CTX 36 (SUTURE) ×4
SUT PLAIN 0 NONE (SUTURE) IMPLANT
SUT PLAIN 2 0 (SUTURE) ×2
SUT PLAIN ABS 2-0 CT1 27XMFL (SUTURE) ×2 IMPLANT
SUT VIC AB 0 CT1 27 (SUTURE) ×2
SUT VIC AB 0 CT1 27XBRD ANBCTR (SUTURE) ×2 IMPLANT
SUT VIC AB 4-0 KS 27 (SUTURE) ×3 IMPLANT
TOWEL OR 17X24 6PK STRL BLUE (TOWEL DISPOSABLE) ×3 IMPLANT
TRAY FOLEY W/BAG SLVR 14FR LF (SET/KITS/TRAYS/PACK) IMPLANT
WATER STERILE IRR 1000ML POUR (IV SOLUTION) ×3 IMPLANT

## 2021-12-16 NOTE — Transfer of Care (Signed)
Immediate Anesthesia Transfer of Care Note  Patient: Angela Meyer  Procedure(s) Performed: CESAREAN SECTION  Patient Location: PACU  Anesthesia Type:Spinal  Level of Consciousness: awake  Airway & Oxygen Therapy: Patient Spontanous Breathing  Post-op Assessment: Report given to RN and Post -op Vital signs reviewed and stable  Post vital signs: Reviewed and stable  Last Vitals:  Vitals Value Taken Time  BP 130/78 12/16/21 1833  Temp    Pulse 59 12/16/21 1835  Resp 13 12/16/21 1835  SpO2 100 % 12/16/21 1835  Vitals shown include unvalidated device data.  Last Pain:  Vitals:   12/16/21 1420  TempSrc:   PainSc: 0-No pain         Complications: No notable events documented.

## 2021-12-17 ENCOUNTER — Encounter (HOSPITAL_COMMUNITY): Payer: Self-pay | Admitting: Obstetrics and Gynecology

## 2021-12-17 LAB — COMPREHENSIVE METABOLIC PANEL
ALT: 14 U/L (ref 0–44)
AST: 27 U/L (ref 15–41)
Albumin: 1.9 g/dL — ABNORMAL LOW (ref 3.5–5.0)
Alkaline Phosphatase: 97 U/L (ref 38–126)
Anion gap: 6 (ref 5–15)
BUN: <5 mg/dL — ABNORMAL LOW (ref 6–20)
CO2: 24 mmol/L (ref 22–32)
Calcium: 7.5 mg/dL — ABNORMAL LOW (ref 8.9–10.3)
Chloride: 102 mmol/L (ref 98–111)
Creatinine, Ser: 0.67 mg/dL (ref 0.44–1.00)
GFR, Estimated: >60 mL/min (ref >60)
Glucose, Bld: 152 mg/dL — ABNORMAL HIGH (ref 70–99)
Potassium: 3.6 mmol/L (ref 3.5–5.1)
Sodium: 132 mmol/L — ABNORMAL LOW (ref 135–145)
Total Bilirubin: 0.6 mg/dL (ref 0.3–1.2)
Total Protein: 4.3 g/dL — ABNORMAL LOW (ref 6.5–8.1)

## 2021-12-17 LAB — CBC
HCT: 28.3 % — ABNORMAL LOW (ref 36.0–46.0)
Hemoglobin: 8.9 g/dL — ABNORMAL LOW (ref 12.0–15.0)
MCH: 25.5 pg — ABNORMAL LOW (ref 26.0–34.0)
MCHC: 31.4 g/dL (ref 30.0–36.0)
MCV: 81.1 fL (ref 80.0–100.0)
Platelets: 235 10*3/uL (ref 150–400)
RBC: 3.49 MIL/uL — ABNORMAL LOW (ref 3.87–5.11)
RDW: 14.4 % (ref 11.5–15.5)
WBC: 8.5 10*3/uL (ref 4.0–10.5)
nRBC: 0 % (ref 0.0–0.2)

## 2021-12-17 LAB — GLUCOSE, CAPILLARY
Glucose-Capillary: 155 mg/dL — ABNORMAL HIGH (ref 70–99)
Glucose-Capillary: 237 mg/dL — ABNORMAL HIGH (ref 70–99)
Glucose-Capillary: 158 mg/dL — ABNORMAL HIGH (ref 70–99)
Glucose-Capillary: 142 mg/dL — ABNORMAL HIGH (ref 70–99)
Glucose-Capillary: 199 mg/dL — ABNORMAL HIGH (ref 70–99)
Glucose-Capillary: 195 mg/dL — ABNORMAL HIGH (ref 70–99)

## 2021-12-17 LAB — RPR: RPR Ser Ql: NONREACTIVE

## 2021-12-17 MED ORDER — SODIUM CHLORIDE 0.9 % IV SOLN
500.0000 mg | Freq: Once | INTRAVENOUS | Status: DC
Start: 2021-12-17 — End: 2021-12-21
  Filled 2021-12-17: qty 25

## 2021-12-17 MED ORDER — POLYSACCHARIDE IRON COMPLEX 150 MG PO CAPS
150.0000 mg | ORAL_CAPSULE | Freq: Every day | ORAL | Status: DC
  Administered 2021-12-18 – 2021-12-21 (×4): 150 mg via ORAL
  Filled 2021-12-17 (×4): qty 1

## 2021-12-17 MED ORDER — NIFEDIPINE ER OSMOTIC RELEASE 30 MG PO TB24
30.0000 mg | ORAL_TABLET | Freq: Every day | ORAL | Status: DC
  Administered 2021-12-17: 30 mg via ORAL
  Filled 2021-12-17: qty 1

## 2021-12-17 MED ORDER — NIFEDIPINE ER OSMOTIC RELEASE 30 MG PO TB24
30.0000 mg | ORAL_TABLET | Freq: Once | ORAL | Status: AC
Start: 2021-12-17 — End: 2021-12-17
  Administered 2021-12-17: 30 mg via ORAL
  Filled 2021-12-17: qty 1

## 2021-12-17 NOTE — Progress Notes (Signed)
Patient did consume a snack 1 hour prior to fasting CBG.

## 2021-12-17 NOTE — Inpatient Diabetes Management (Signed)
Inpatient Diabetes Program Recommendations  AACE/ADA: New Consensus Statement on Inpatient Glycemic Control (2015)  Target Ranges:  Prepandial:   less than 140 mg/dL      Peak postprandial:   less than 180 mg/dL (1-2 hours)      Critically ill patients:  140 - 180 mg/dL   Lab Results  Component Value Date   GLUCAP 237 (H) 12/17/2021   HGBA1C 8.5 (H) 12/13/2021    Review of Glycemic Control  Latest Reference Range & Units 12/16/21 22:03 12/17/21 04:48 12/17/21 07:40  Glucose-Capillary 70 - 99 mg/dL 66 (L) 161 (H) 096 (H)  (L): Data is abnormally low (H): Data is abnormally high Diabetes history: T1DM Outpatient Diabetes medications: Toujeo 42 units QHS, Humalog TID 1:3 g CHO, SSI TID Prior to Pregnancy: Humalog 1:10 g CHO, Toujeo 26 units QHS Current orders for Inpatient glycemic control: Semglee 21 units QHS, Novolog 4 units TID, Novolog 0-15 units TID & HS  Inpatient Diabetes Program Recommendations:    Noted consult; patient now delivered.   Patient is followed by Dr Shawnee Knapp, outpatient endocrinology.   CBG this AM is 237 mg/dL, however, this is not a fasting and patient consumed a meal at 0300 without CHO coverage. Thus, would continue with current orders; in agreement.   Following.   Thanks, Lujean Rave, MSN, RNC-OB Diabetes Coordinator [phone] (8a-5p)

## 2021-12-17 NOTE — Lactation Note (Signed)
This note was copied from a baby's chart. Lactation Consultation Note  Patient Name: Angela Meyer Date: 12/17/2021   Age:28 hours  This LC called [employer] to F/U on pump status. Her representative reported that they have got the order from [employer] but that the order has not been signed by the provider yet. Sent another secure message to ask provider to sign the order, but still no response. RN Francetta Found also notified to contact provider to sign the order on Epic; that's the only thing [employer] is missing for pump issuance.     Brendt Dible S Jennafer Gladue 12/17/2021, 5:34 PM

## 2021-12-18 LAB — GLUCOSE, CAPILLARY
Glucose-Capillary: 90 mg/dL (ref 70–99)
Glucose-Capillary: 171 mg/dL — ABNORMAL HIGH (ref 70–99)
Glucose-Capillary: 179 mg/dL — ABNORMAL HIGH (ref 70–99)
Glucose-Capillary: 173 mg/dL — ABNORMAL HIGH (ref 70–99)
Glucose-Capillary: 83 mg/dL (ref 70–99)
Glucose-Capillary: 124 mg/dL — ABNORMAL HIGH (ref 70–99)
Glucose-Capillary: 126 mg/dL — ABNORMAL HIGH (ref 70–99)
Glucose-Capillary: 207 mg/dL — ABNORMAL HIGH (ref 70–99)

## 2021-12-18 LAB — BIRTH TISSUE RECOVERY COLLECTION (PLACENTA DONATION)

## 2021-12-18 MED ORDER — NIFEDIPINE ER OSMOTIC RELEASE 60 MG PO TB24
60.0000 mg | ORAL_TABLET | Freq: Every day | ORAL | Status: DC
  Administered 2021-12-18 – 2021-12-21 (×4): 60 mg via ORAL
  Filled 2021-12-18 (×4): qty 1

## 2021-12-18 NOTE — Progress Notes (Signed)
POD#2  S: Pt notes pain worse today than yesterday but overall controlled w/ po meds, minimal lochia, nl void, out of bed w/o dizziness or chest pain, no headache, no right upper quadrant pain, no vision change.  Happy to be off magnesium.  Voiding normally.  tol reg po, no flatus yet. Pt is planning on breastfeeding.  Baby in NICU  Vitals:   12/17/21 1736 12/17/21 1928 12/18/21 0116 12/18/21 0344  BP:  133/86 (!) 149/86 (!) 154/87  Pulse:  77 69 83  Resp: 15 16 18 16   Temp:  (!) 96.6 F (35.9 C) 98 F (36.7 C)   TempSrc:  Axillary Axillary   SpO2:  100% 100% 100%  Weight:      Height:        Gen: well appearing though appears stressed CV: RRR Pulm: CTAB Abd: soft distention with tympany, approp tender, fundus below umbilicus, NT Inc: C/D/I,  LE: tr edema, NT  CBC    Component Value Date/Time   WBC 8.5 12/17/2021 0450   RBC 3.49 (L) 12/17/2021 0450   HGB 8.9 (L) 12/17/2021 0450   HCT 28.3 (L) 12/17/2021 0450   PLT 235 12/17/2021 0450   MCV 81.1 12/17/2021 0450   MCH 25.5 (L) 12/17/2021 0450   MCHC 31.4 12/17/2021 0450   RDW 14.4 12/17/2021 0450   LYMPHSABS 1.6 04/20/2018 1200   MONOABS 0.4 04/20/2018 1200   EOSABS 0.2 04/20/2018 1200   BASOSABS 0.0 04/20/2018 1200    A/P: POD#1 s/p primary cesarean section at 34 weeks for preeclampsia with severe range blood pressures and poorly controlled pre-existing type 1 diabetes - post-op. Doing well.  Continue routine care -Type 1 diabetes.  Appreciate management by diabetic coordinator.  Postpartum blood sugars still suboptimal.  -Preeclampsia.  Magnesium stopped last night.  Labs have been normal.  Patient without symptoms of preeclampsia.  Blood pressures remain elevated and Procardia increased from 30 to 60 mg extended release this morning.  We will continue to follow. -Anemia.  Patient declined IV iron yesterday.  Importance and rationale for this discussed with patient who is agreeable to receiving IV iron today.  04/22/2018 12/18/2021 8:03 AM    12/20/2021 12/18/2021 7:59 AM

## 2021-12-18 NOTE — Progress Notes (Signed)
Patient screened out for psychosocial assessment since none of the following apply: °Psychosocial stressors documented in mother or baby's chart °Gestation less than 32 weeks °Code at delivery  °Infant with anomalies °Please contact the Clinical Social Worker if specific needs arise, by MOB's request, or if MOB scores greater than 9/yes to question 10 on Edinburgh Postpartum Depression Screen. ° °Basem Yannuzzi Boyd-Gilyard, MSW, LCSW °Clinical Social Work °[phone] °  °

## 2021-12-18 NOTE — Anesthesia Postprocedure Evaluation (Signed)
Anesthesia Post Note  Patient: Angela Meyer  Procedure(s) Performed: CESAREAN SECTION     Patient location during evaluation: PACU Anesthesia Type: Spinal Level of consciousness: oriented and awake and alert Pain management: pain level controlled Vital Signs Assessment: post-procedure vital signs reviewed and stable Respiratory status: spontaneous breathing, respiratory function stable and patient connected to nasal cannula oxygen Cardiovascular status: blood pressure returned to baseline and stable Postop Assessment: no headache, no backache and no apparent nausea or vomiting Anesthetic complications: no   No notable events documented.  Last Vitals:  Vitals:   12/18/21 0344 12/18/21 0816  BP: (!) 154/87 137/82  Pulse: 83 67  Resp: 16 15  Temp:  36.4 C  SpO2: 100% 99%    Last Pain:  Vitals:   12/18/21 0816  TempSrc: Oral  PainSc:                  Joley Utecht S

## 2021-12-19 LAB — GLUCOSE, CAPILLARY
Glucose-Capillary: 105 mg/dL — ABNORMAL HIGH (ref 70–99)
Glucose-Capillary: 121 mg/dL — ABNORMAL HIGH (ref 70–99)
Glucose-Capillary: 126 mg/dL — ABNORMAL HIGH (ref 70–99)
Glucose-Capillary: 139 mg/dL — ABNORMAL HIGH (ref 70–99)
Glucose-Capillary: 196 mg/dL — ABNORMAL HIGH (ref 70–99)
Glucose-Capillary: 274 mg/dL — ABNORMAL HIGH (ref 70–99)
Glucose-Capillary: 46 mg/dL — ABNORMAL LOW (ref 70–99)
Glucose-Capillary: 64 mg/dL — ABNORMAL LOW (ref 70–99)
Glucose-Capillary: 72 mg/dL (ref 70–99)

## 2021-12-19 MED ORDER — LABETALOL HCL 100 MG PO TABS
100.0000 mg | ORAL_TABLET | Freq: Two times a day (BID) | ORAL | Status: DC
  Administered 2021-12-19 – 2021-12-20 (×3): 100 mg via ORAL
  Filled 2021-12-19 (×3): qty 1

## 2021-12-19 MED ORDER — MAGNESIUM OXIDE -MG SUPPLEMENT 400 (240 MG) MG PO TABS
400.0000 mg | ORAL_TABLET | Freq: Every day | ORAL | Status: DC
  Administered 2021-12-19 – 2021-12-21 (×3): 400 mg via ORAL
  Filled 2021-12-19 (×3): qty 1

## 2021-12-19 NOTE — Progress Notes (Signed)
   Subjective: POD# 3 Live born female  Birth Weight: 7 lb 11.1 oz (3490 g) APGAR: 9, 10  Newborn Delivery   Birth date/time: 12/16/2021 17:38:00 Delivery type: C-Section, Low Transverse Trial of labor: No C-section categorization: Primary     Baby name: [name] Delivering provider: Clance Boll A  NICU admit  circumcision planned  Feeding: breast, pumping  Pain control at delivery: Spinal   Reports feeling well, denies PEC sx. Has been up ambulating to NICU, not a lot of flatus yet, some increased cramping today and using heating pad over lower abdomen for comfort. No N/V. PO medications for pain helping. Bleeding is small.   Received IV antihypertensive x 1 dose during night for severe range BP's.   Objective:   VS:    Vitals:   12/18/21 2318 12/19/21 0003 12/19/21 0241 12/19/21 0409  BP: (!) 166/91 (!) 150/87 (!) 158/96   Pulse: 67 63 74   Resp:  16 17   Temp:  98.1 F (36.7 C)  98.7 F (37.1 C)  TempSrc:  Oral    SpO2:  100% 100%   Weight:      Height:         Intake/Output Summary (Last 24 hours) at 12/19/2021 0908 Last data filed at 12/19/2021 1962 Gross per 24 hour  Intake 480 ml  Output 3900 ml  Net -3420 ml     CBG (last 3)  Recent Labs    12/19/21 0239 12/19/21 0409 12/19/21 0922  GLUCAP 64* 139* 72      Recent Labs    12/16/21 1453 12/17/21 0450  WBC 5.8 8.5  HGB 10.6* 8.9*  HCT 32.5* 28.3*  PLT 276 235     Blood type: --/--/O POS (06/26 1453)  Rubella:   immune Vaccines: TDaP          planned PP   Physical Exam:  General: alert, cooperative, and no distress Abdomen: soft, nontender, normal bowel sounds, moderate gas distention Incision: clean, dry, and intact Uterine Fundus: firm, below umbilicus, nontender Lochia: minimal Ext: no edema, redness or tenderness in the calves or thighs  Assessment/Plan: 26 y.o.   POD# 2. [id number]                  Principal Problem:   Postpartum care following cesarean delivery 6/26 Active  Problems:   Hyperthyroidism   Type 1 diabetes mellitus (HCC)  - care per diabetes coordinator consult  - PP BG control improving   Status post primary low transverse cesarean section 6/26   Severe preeclampsia  - S/P Mag Sulfate x 24 hrs PP. Procardia increased to daily 60XL yesterday, patient still needed one dose IV labetalol last night for severe range BP. No neural sx of PEC. Will add labetalol 100 mg BID and continue inpatient to monitor, titrate as needed.    Iron deficiency anemia, asymptomatic  - s/p IV Venofer  - continue oral Fe and Mag Ox   Routine post-op care  POC in consult w/ Dr. Alvin Critchley, CNM, MSN 12/19/2021, 9:08 AM

## 2021-12-20 LAB — GLUCOSE, CAPILLARY
Glucose-Capillary: 125 mg/dL — ABNORMAL HIGH (ref 70–99)
Glucose-Capillary: 128 mg/dL — ABNORMAL HIGH (ref 70–99)
Glucose-Capillary: 165 mg/dL — ABNORMAL HIGH (ref 70–99)
Glucose-Capillary: 172 mg/dL — ABNORMAL HIGH (ref 70–99)
Glucose-Capillary: 173 mg/dL — ABNORMAL HIGH (ref 70–99)
Glucose-Capillary: 275 mg/dL — ABNORMAL HIGH (ref 70–99)

## 2021-12-20 MED ORDER — LABETALOL HCL 200 MG PO TABS
300.0000 mg | ORAL_TABLET | Freq: Three times a day (TID) | ORAL | Status: DC
  Administered 2021-12-20 – 2021-12-21 (×3): 300 mg via ORAL
  Filled 2021-12-20 (×3): qty 1

## 2021-12-20 MED ORDER — NIFEDIPINE ER OSMOTIC RELEASE 30 MG PO TB24
30.0000 mg | ORAL_TABLET | Freq: Every day | ORAL | Status: DC
  Administered 2021-12-20: 30 mg via ORAL
  Filled 2021-12-20: qty 1

## 2021-12-20 MED ORDER — LABETALOL HCL 200 MG PO TABS: 300.0000 mg | ORAL_TABLET | Freq: Two times a day (BID) | ORAL | Status: DC

## 2021-12-20 NOTE — Inpatient Diabetes Management (Addendum)
Inpatient Diabetes Program Recommendations  AACE/ADA: New Consensus Statement on Inpatient Glycemic Control (2015)  Target Ranges:  Prepandial:   less than 140 mg/dL      Peak postprandial:   less than 180 mg/dL (1-2 hours)      Critically ill patients:  140 - 180 mg/dL   Lab Results  Component Value Date   GLUCAP 173 (H) 12/20/2021   HGBA1C 8.5 (H) 12/13/2021    Review of Glycemic Control  Latest Reference Range & Units 12/19/21 22:19 12/19/21 23:58 12/20/21 00:46 12/20/21 04:39 12/20/21 08:00  Glucose-Capillary 70 - 99 mg/dL 491 (H) 46 (L) 791 (H) 275 (H) 173 (H)  (H): Data is abnormally high (L): Data is abnormally low  Diabetes history: T1DM Outpatient Diabetes medications: Toujeo 42 units QHS, Humalog TID 1:3 g CHO, SSI TID Prior to Pregnancy: Humalog 1:10 g CHO, Toujeo 26 units QHS Current orders for Inpatient glycemic control: Semglee 21 units QHS, Novolog 4 units TID, Novolog 0-15 units TID & HS  Inpatient Diabetes Program Recommendations:    Please decrease correction to Novolog 0-9 units TID & keep 0-5 units QHS to avoid overcorrection and hypoglycemia.   Will continue to follow while inpatient.  Thank you, Dulce Sellar, MSN, CDCES Diabetes Coordinator Inpatient Diabetes Program [phone] (team pager from 8a-5p)

## 2021-12-20 NOTE — Lactation Note (Signed)
This note was copied from a baby's chart.  NICU Lactation Consultation Note  Patient Name: Angela Meyer [id number] Date: 12/20/2021 Age:28 days   Subjective Reason for consult: Follow-up assessment Mother is pumping infrequently and has not experienced + breast changes at 90 hours pp. She is still pending a breast pump through [employer]. Her RN will f/u to verify status and provider's signature on rx.   I assisted mother with pumping and encouraged her to increase the frequency of pumping.   Objective Infant data: Mother's Current Feeding Choice: Breast Milk and Donor Milk  Infant feeding assessment Scale for Readiness: 3  Maternal data: G1P0101  C-Section, Low Transverse Pumping frequency: 3 times yesterday  Risk factor for low milk supply:: delay of copious milk   Pump: DEBP, Personal (Mom Cozy, will call [employer] to get insurance pump per mom's request)  Assessment Maternal: At risk for low milk supply  Intervention/Plan Interventions: Education  Plan: Consult Status: NICU follow-up  NICU Follow-up type: Verify absence of engorgement; Verify onset of copious milk  Mother to increase pumping to q3h [facility] to f/u on issuance of pump  Elder Negus 12/20/2021, 1:43 PM

## 2021-12-20 NOTE — Progress Notes (Signed)
Subjective: POD# 4  1' LTCS for preeclampsia w/ severe features at 34 wks. Awaiting better BP and BS control before discharge   Live born female , at 34 wks, at 7 lb 11.1 oz (3490 g), APGAR: 9, 10  Baby name: [name], NICU admit  circumcision planned. Feeding: breast, pumping  Objective:   BP (!) 159/105 (BP Location: Right Arm)   Pulse 81   Temp 98 F (36.7 C) (Oral)   Resp 18   Ht 5\' 2"  (1.575 m)   Wt 80.7 kg   SpO2 100%   Breastfeeding Unknown   BMI 32.56 kg/m    Vitals:   12/19/21 2058 12/19/21 2221 12/20/21 0437 12/20/21 0805  BP: (!) 144/86 (!) 144/83 (!) 157/90 (!) 159/105  Pulse: 76 75 100 81  Resp: 17 17 17 18   Temp: 98.6 F (37 C) 99.2 F (37.3 C) 98.9 F (37.2 C) 98 F (36.7 C)  TempSrc: Oral Oral Oral Oral  SpO2: 100% 100% 97% 100%  Weight:      Height:         CBG (last 3)  Recent Labs    12/20/21 0046 12/20/21 0439 12/20/21 0800  GLUCAP 128* 275* 173*        Latest Ref Rng & Units 12/17/2021    4:50 AM 12/16/2021    2:53 PM 12/13/2021    2:19 PM  CBC  WBC 4.0 - 10.5 K/uL 8.5  5.8  6.0   Hemoglobin 12.0 - 15.0 g/dL 8.9  12/18/2021  12/15/2021   Hematocrit 36.0 - 46.0 % 28.3  32.5  31.9   Platelets 150 - 400 K/uL 235  276  274        Latest Ref Rng & Units 12/17/2021    4:50 AM 12/16/2021    2:53 PM 12/13/2021    2:19 PM  CMP  Glucose 70 - 99 mg/dL 12/18/2021  12/15/2021  993   BUN 6 - 20 mg/dL <5  6  <5   Creatinine 0.44 - 1.00 mg/dL 716  967  8.93   Sodium 135 - 145 mmol/L 132  134  135   Potassium 3.5 - 5.1 mmol/L 3.6  3.7  3.9   Chloride 98 - 111 mmol/L 102  108  105   CO2 22 - 32 mmol/L 24  21  22    Calcium 8.9 - 10.3 mg/dL 7.5  8.5  8.6   Total Protein 6.5 - 8.1 g/dL 4.3  5.4  5.5   Total Bilirubin 0.3 - 1.2 mg/dL 0.6  0.4  0.4   Alkaline Phos 38 - 126 U/L 97  153  146   AST 15 - 41 U/L 27  22  20    ALT 0 - 44 U/L 14  17  15       Blood type: --/--/O POS (06/26 1453) Rubella:   immune Vaccines: TDaP  planned PP  Physical Exam:  General: alert,  cooperative, and no distress Abdomen: soft, nontender, normal bowel sounds, moderate gas distention Incision: clean, dry, and intact Uterine Fundus: firm, below umbilicus, nontender Lochia: minimal Ext: no edema, redness or tenderness in the calves or thighs  Assessment/Plan: 26 y.o.   POD# 4. Emergency LTCS at 34 wks                  1) Preterm Preeclampsia with severe features, mainly severe range BPs - suboptimal control on Procardia 60 XL in AM and Labetalol 100mg  BID. Change to Procardia 60mg  XL  in AM, add 30 mg XL in PM and Labetalol to 300mg  BID, possible discharge tomorrow if better control 2) Type 1 diabetes mellitus - care per diabetes coordinator consult, Long acting 21 U and SSI short acting w/ meals.Needs better titration. Hoping to control by tomorrow for Discharge planning  3) Hyperthyroidism- Graves, no acute changes  4) Status post primary low transverse cesarean section 6/26 5) Iron deficiency anemia, asymptomatic, S/p IV Venofer, continue oral Fe and Mag Ox  Dw pt need to stay another day due to BPs still not well controlled. Agreed. Warning ss dw pt   -Jaylenn Altier, MD 7/26 Obgyn

## 2021-12-20 NOTE — Progress Notes (Signed)
This RN received call from Dr. Amado Nash to change pt's BP medication schedule. Per Dr. Amado Nash  she wants pt to have Procardia 60mg  PO at 0800 and then Procardia 30mg  PO at 2000; with Labetalol 300mg  PO TID to start being given now at 1800, again at 0200 and again at 1000 with following that schedule. Verbal order with read back received and discussed with Tanya in satellite pharmacy.

## 2021-12-21 LAB — GLUCOSE, CAPILLARY
Glucose-Capillary: 136 mg/dL — ABNORMAL HIGH (ref 70–99)
Glucose-Capillary: 133 mg/dL — ABNORMAL HIGH (ref 70–99)
Glucose-Capillary: 127 mg/dL — ABNORMAL HIGH (ref 70–99)

## 2021-12-21 MED ORDER — IBUPROFEN 600 MG PO TABS: 600.0000 mg | ORAL_TABLET | Freq: Four times a day (QID) | ORAL | 0 refills | Status: DC

## 2021-12-21 MED ORDER — NIFEDIPINE ER 30 MG PO TB24: 30.0000 mg | ORAL_TABLET | Freq: Every day | ORAL | 1 refills | Status: DC

## 2021-12-21 MED ORDER — NIFEDIPINE ER 60 MG PO TB24: 60.0000 mg | ORAL_TABLET | Freq: Every day | ORAL | 1 refills | Status: DC

## 2021-12-21 MED ORDER — TOUJEO MAX SOLOSTAR 300 UNIT/ML ~~LOC~~ SOPN: 21.0000 [IU] | PEN_INJECTOR | Freq: Every day | SUBCUTANEOUS | 2 refills | Status: DC

## 2021-12-21 MED ORDER — INSULIN LISPRO 100 UNIT/ML IJ SOLN
4.0000 [IU] | Freq: Three times a day (TID) | INTRAMUSCULAR | 0 refills | Status: DC
Start: 2021-12-21 — End: 2023-09-23

## 2021-12-21 MED ORDER — LABETALOL HCL 300 MG PO TABS: 300.0000 mg | ORAL_TABLET | Freq: Three times a day (TID) | ORAL | 1 refills | Status: DC

## 2021-12-21 MED ORDER — OXYCODONE HCL 5 MG PO TABS
5.0000 mg | ORAL_TABLET | ORAL | 0 refills | Status: DC | PRN
Start: 2021-12-21 — End: 2023-10-20

## 2021-12-21 NOTE — Progress Notes (Signed)
Call received from MD regarding D/C medications.  Confirmed reduced doses of insulin with patient and encouraged f/u with Dr. Shawnee Knapp.  She needs refill on the Humalog pens.  Discussed with MD.   Thanks , Beryl Meager, RN, BC-ADM Inpatient Diabetes Coordinator Pager [phone]  (8a-5p)

## 2021-12-21 NOTE — Discharge Summary (Signed)
Postpartum Discharge Summary  Date of Service updated     Patient Name: Angela Meyer DOB: Aug 08, 1995 MRN: [id number]  Date of admission: 12/16/2021 Delivery date:12/16/2021  Delivering provider: Clance Boll A  Date of discharge: 12/21/2021  Admitting diagnosis: Postpartum care following cesarean delivery [Z39.2] Intrauterine pregnancy: [redacted]w[redacted]d     Secondary diagnosis:  Principal Problem:   Postpartum care following cesarean delivery 6/26 Active Problems:   Hyperthyroidism   Type 1 diabetes mellitus (HCC)   Status post primary low transverse cesarean section 6/26   Severe preeclampsia   Iron deficiency anemia  Additional problems: none    Discharge diagnosis: Preterm Pregnancy Delivered, Preeclampsia (severe), and iddm, hyperthyroid                                               Post partum procedures: none Augmentation:  none Complications: None  Hospital course: Sceduled C/S   26 y.o. yo G1P0101 at [redacted]w[redacted]d was admitted to the hospital 12/16/2021 for scheduled cesarean section with the following indication: severe pre-eclampsia, declined IOL .Delivery details are as follows:  Membrane Rupture Time/Date: 5:38 PM ,12/16/2021   Delivery Method:C-Section, Low Transverse  Details of operation can be found in separate operative note.  Patient had an uncomplicated postpartum course.  She is ambulating, tolerating a regular diet, passing flatus, and urinating well. Patient is discharged home in stable condition on  12/21/21        Newborn Data: Birth date:12/16/2021  Birth time:5:38 PM  Gender:Female  Living status:Living  Apgars:9 ,10  Weight:3490 g     Magnesium Sulfate received: Yes: Seizure prophylaxis BMZ received: No Rhophylac:No  Physical exam  Vitals:   12/20/21 2325 12/21/21 0429 12/21/21 0740 12/21/21 1140  BP: (!) 147/92 132/80 137/79 118/72  Pulse: 87 95 92 84  Resp: 18 16 18 18   Temp: 98.8 F (37.1 C) 98.4 F (36.9 C) 98.8 F (37.1 C) 98.4 F (36.9 C)   TempSrc: Oral Oral Oral Oral  SpO2: 100% 99% 98% 98%  Weight:      Height:       General: alert, cooperative, and no distress Lochia: appropriate Uterine Fundus: firm Incision: Healing well with no significant drainage DVT Evaluation: No evidence of DVT seen on physical exam. Labs: Lab Results  Component Value Date   WBC 8.5 12/17/2021   HGB 8.9 (L) 12/17/2021   HCT 28.3 (L) 12/17/2021   MCV 81.1 12/17/2021   PLT 235 12/17/2021      Latest Ref Rng & Units 12/17/2021    4:50 AM  CMP  Glucose 70 - 99 mg/dL 12/19/2021   BUN 6 - 20 mg/dL <5   Creatinine 390 - 1.00 mg/dL 3.00   Sodium 9.23 - 300 mmol/L 132   Potassium 3.5 - 5.1 mmol/L 3.6   Chloride 98 - 111 mmol/L 102   CO2 22 - 32 mmol/L 24   Calcium 8.9 - 10.3 mg/dL 7.5   Total Protein 6.5 - 8.1 g/dL 4.3   Total Bilirubin 0.3 - 1.2 mg/dL 0.6   Alkaline Phos 38 - 126 U/L 97   AST 15 - 41 U/L 27   ALT 0 - 44 U/L 14    Edinburgh Score:     No data to display            After visit meds:  Discharge home in stable condition Infant Feeding: per NICU, plans breastfeeding Infant Disposition:NICU Discharge instruction: per After Visit Summary and Postpartum booklet. Activity: Advance as tolerated. Pelvic rest for 6 weeks.  Diet: carb modified diet Anticipated Birth Control: Unsure Postpartum Appointment:6 weeks Additional Postpartum F/U: BP check 1 week Future Appointments:No future appointments. Follow up Visit:  Follow-up Information     Law, Cassandra A, DO Follow up.   Specialty: Obstetrics and Gynecology Contact information: [address] [place] [address] [phone]                     12/21/2021 Vick Frees, MD

## 2021-12-21 NOTE — Lactation Note (Signed)
This note was copied from a baby's chart.  NICU Lactation Consultation Note  Patient Name: Angela Meyer [id number] Date: 12/21/2021 Age:28 days   Subjective Reason for consult: Follow-up assessment; Primapara; 1st time breastfeeding; NICU baby; Late-preterm 34-36.6wks   Lactation followed up with Angela Meyer in NICU with baby Angela Meyer. She is noting onset of lactogenesis II; she reports that she began to pump more consistently on 6/30 and is pleased with the changes.  Her Stork pump was delivered. I offered to assist her with set up of the [facility] pump, and I offered to assist her with practicing lick and learn with baby. She verbalized interest and will page when ready for assistance.  Objective Infant data: Mother's Current Feeding Choice: Breast Milk and Donor Milk  Infant feeding assessment Scale for Readiness: 2 Scale for Quality: 4  Maternal data: G1P0101  C-Section, Low Transverse  Current breast feeding challenges:: NICU  Pumping frequency: q3 hours as of 6/30 Pumped volume: 15 mL  Risk factor for low milk supply:: delay of copious milk   Pump: Stork Pump  Assessment  Maternal: Milk volume: Normal   Intervention/Plan Interventions: Education  Pump Education: Setup, frequency, and cleaning  Plan: Consult Status: NICU follow-up  NICU Follow-up type: Verify absence of engorgement; Verify onset of copious milk; Weekly NICU follow up    Angela Meyer 12/21/2021, 9:43 AM

## 2021-12-21 NOTE — Progress Notes (Signed)
No c/o; ready for d/c; tol po, ambulating, nml lochia; +flatus Voids w/o difficulty; baby improving status in nicu Pain controlled  Patient Vitals for the past 24 hrs:  BP Temp Temp src Pulse Resp SpO2  12/21/21 1140 118/72 98.4 F (36.9 C) Oral 84 18 98 %  12/21/21 0740 137/79 98.8 F (37.1 C) Oral 92 18 98 %  12/21/21 0429 132/80 98.4 F (36.9 C) Oral 95 16 99 %  12/20/21 2325 (!) 147/92 98.8 F (37.1 C) Oral 87 18 100 %  12/20/21 1947 136/83 99 F (37.2 C) Oral 75 18 100 %  12/20/21 1637 (!) 157/97 -- -- 77 -- --  12/20/21 1625 (!) 159/96 98.4 F (36.9 C) Oral 85 18 100 %   A&ox3 Rrr Ctab Abd: soft,nt,nd, +bs; fundus firm and below umb; saturated dressing, old blood LE: no edema, nt bilat  Results for orders placed or performed during the hospital encounter of 12/16/21 (from the past 24 hour(s))  Glucose, capillary     Status: Abnormal   Collection Time: 12/20/21  7:48 PM  Result Value Ref Range   Glucose-Capillary 172 (H) 70 - 99 mg/dL  Glucose, capillary     Status: Abnormal   Collection Time: 12/20/21 10:37 PM  Result Value Ref Range   Glucose-Capillary 125 (H) 70 - 99 mg/dL  Glucose, capillary     Status: Abnormal   Collection Time: 12/21/21  6:28 AM  Result Value Ref Range   Glucose-Capillary 136 (H) 70 - 99 mg/dL  Glucose, capillary     Status: Abnormal   Collection Time: 12/21/21 10:18 AM  Result Value Ref Range   Glucose-Capillary 133 (H) 70 - 99 mg/dL  Glucose, capillary     Status: Abnormal   Collection Time: 12/21/21 10:50 AM  Result Value Ref Range   Glucose-Capillary 127 (H) 70 - 99 mg/dL      Latest Ref Rng & Units 12/17/2021    4:50 AM 12/16/2021    2:53 PM 12/13/2021    2:19 PM  CBC  WBC 4.0 - 10.5 K/uL 8.5  5.8  6.0   Hemoglobin 12.0 - 15.0 g/dL 8.9  10.6  10.4   Hematocrit 36.0 - 46.0 % 28.3  32.5  31.9   Platelets 150 - 400 K/uL 235  276  274       Latest Ref Rng & Units 12/17/2021    4:50 AM 12/16/2021    2:53 PM 12/13/2021    2:19 PM   CMP  Glucose 70 - 99 mg/dL 152  174  264   BUN 6 - 20 mg/dL <5  6  <5   Creatinine 0.44 - 1.00 mg/dL 0.67  0.65  0.72   Sodium 135 - 145 mmol/L 132  134  135   Potassium 3.5 - 5.1 mmol/L 3.6  3.7  3.9   Chloride 98 - 111 mmol/L 102  108  105   CO2 22 - 32 mmol/L _0 Calcium 8.9 - 10.3 mg/dL 7.5  8.5  8.6   Total Protein 6.5 - 8.1 g/dL 4.3  5.4  5.5   Total Bilirubin 0.3 - 1.2 mg/dL 0.6  0.4  0.4   Alkaline Phos 38 - 126 U/L 97  153  146   AST 15 - 41 U/L _1 ALT 0 - 44 U/L _2 A/P: pod5 s/p emergency ltcs at 34wga Severe pre-e: s/p 24  hr mag sulfate; now on procardia 27m xl in am, 349mxl in pm, labetalol 30055mid; bps much improved sine yesterday; no c/o; plan d/c home today with office f/u this week Type 1 DM: diabetes coordinator following; long acting 21U q hs, short acting with meals 4 U then ssi for correction; has endocrinologist to manage as outpatient; still not well controlled Hyperthyroidism - graves, stable Post op- met milestones, ready for d/c home IDA - asymptomatic, s/p iv iron, plan oral iron and mag ox

## 2021-12-25 ENCOUNTER — Telehealth (HOSPITAL_COMMUNITY): Payer: Self-pay | Admitting: *Deleted

## 2021-12-25 NOTE — Telephone Encounter (Signed)
Left phone voicemail message.  Duffy Rhody, RN 12-25-2021 at 12:55pm

## 2021-12-27 ENCOUNTER — Ambulatory Visit: Payer: Self-pay

## 2021-12-27 NOTE — Lactation Note (Signed)
This note was copied from a baby's chart. Lactation Consultation Note  Patient Name: Angela Meyer [id number] Date: 12/27/2021 Reason for consult: Follow-up assessment;1st time breastfeeding;Primapara;NICU baby;Late-preterm 34-36.6wks;Maternal endocrine disorder Age:26 years  Visited with mom of 28 67/41 weeks old (adjusted) NICU female, she's a P1 and reports she's been taking baby to breast for some "lick and learn". This was the second time this LC stopped for a visit today, mom requested a feeding assist for the 12 pm feeding. Baby Angela Meyer has also been taking bottles; no need to pre-pump prior this feeding. LC took baby to mother's left breast in cross cradle hold (see LATCH score) but baby did not latch. Discussed with Angela Meyer the possibility of using a NS when baby is ready, she was open to that idea but also voiced she doesn't want to push baby to feed if he's not ready yet. Angela Meyer reported that pumping is going well, although she's been skipping her pumping sessions at night. Explained the importance of consistent pumping to protect her supply and advised not to go more than 6 hours without pumping in a 24 hours/period, she voiced understanding. Discussed some strategies to increase her supply.   Maternal Data Maternal supply is low and BNL  Feeding Mother's Current Feeding Choice: Breast Milk and Formula Nipple Type: Nfant Extra Slow Flow (gold)  LATCH Score Latch: Too sleepy or reluctant, no latch achieved, no sucking elicited. (will only suck on gloved finger but not at the breast. Mom noted that he'll sometimes "suckle" at the breast, but this time baby was fussy when not sucking on pacifier or gloved finger, he fell asleep after this attempt)  Audible Swallowing: None  Type of Nipple: Everted at rest and after stimulation (short shafted)  Comfort (Breast/Nipple): Soft / non-tender  Hold (Positioning): Assistance needed to correctly position infant at breast and maintain  latch.  LATCH Score: 5  Lactation Tools Discussed/Used Tools: Pump;Flanges Flange Size: 24 Breast pump type: Double-Electric Breast Pump Pump Education: Setup, frequency, and cleaning;Milk Storage Reason for Pumping: LPI in NICU Pumping frequency: 5 times/24 hours Pumped volume: 50 mL (50-60 ml)  Interventions Interventions: Breast feeding basics reviewed;Assisted with latch;Skin to skin;Breast massage;Hand express;Breast compression;Adjust position;Support pillows;DEBP;Education  Plan of care Encouraged mom to pump every 3 hours, at least 8 pumping sessions/24 hours She'll power pump in the AM She'll continue taking baby to breast on feeding cues around feeding times   No other support person at this time. All questions and concerns answered, family to contact Angela Meyer services PRN.  Discharge Pump: Stork Pump  Consult Status Consult Status: NICU follow-up Date: 12/27/21 Follow-up type: In-patient   Angela Meyer Angela Meyer 12/27/2021, 12:38 PM

## 2021-12-30 ENCOUNTER — Ambulatory Visit: Payer: Self-pay

## 2021-12-30 NOTE — Lactation Note (Signed)
This note was copied from a baby's chart. Lactation Consultation Note  Patient Name: Angela Meyer [id number] Date: 12/30/2021 Reason for consult: Follow-up assessment;Primapara;1st time breastfeeding;NICU baby;Late-preterm 34-36.6wks;Maternal endocrine disorder;Mother's request;Breastfeeding assistance Age:28 wk.o.  Visited with mom of 48 76/40 weeks old (adjusted) NICU female, she's a P1 and requested a feeding assist for the 3 pm feeding. Baby "Angela Meyer" was asleep upon my arrival, Ms. Weatherholtz reported that he just had a bath, but that he normally would stay awake when she takes him to breast on her own for some lick & learn. Baby did not wake up to feed, he briefly cried when gently aroused but went back to sleep (see LATCH score) and attempt was documented on flowsheets. Ms. Alaimo also reported that pumping is going better and her volumes have increased; although noticed that she's been pumping a bit less often. Reviewed her breastfeeding and pumping goals, her plan is to continue supplementing with breastmilk and formula.  Feeding Mother's Current Feeding Choice: Breast Milk and Formula Nipple Type: Dr. Levert Feinstein Preemie  LATCH Score Latch: Too sleepy or reluctant, no latch achieved, no sucking elicited.  Audible Swallowing: None  Type of Nipple: Everted at rest and after stimulation  Comfort (Breast/Nipple): Soft / non-tender  Hold (Positioning): Assistance needed to correctly position infant at breast and maintain latch.  LATCH Score: 5  Lactation Tools Discussed/Used Tools: Pump;Flanges Flange Size: 24 Breast pump type: Double-Electric Breast Pump Pump Education: Setup, frequency, and cleaning;Milk Storage Reason for Pumping: LPI in NICU Pumping frequency: 4 times/24 hours Pumped volume: 120 mL  Interventions Interventions: Breast feeding basics reviewed;Assisted with latch;Skin to skin;Breast massage;Hand express;Breast compression;Education;DEBP  Plan of care Encouraged  mom to try pumping every 3 hours, or as close as she could get to 8 pumping sessions/24 hours She'll power pump in the AM She'll continue taking baby to breast on feeding cues around feeding times; she requested a feeding assist for tomorrow at 3 pm, she'd like to try a NS # 20   No other support person at this time. All questions and concerns answered, family to contact [facility] services PRN.  Consult Status Date: 12/30/21 Follow-up type: In-patient   Everett Ricciardelli Venetia Constable 12/30/2021, 3:36 PM

## 2021-12-31 ENCOUNTER — Ambulatory Visit: Payer: Self-pay

## 2021-12-31 NOTE — Lactation Note (Signed)
This note was copied from a baby's chart.  NICU Lactation Consultation Note  Patient Name: Angela Meyer [id number] Date: 12/31/2021 Age:28 wk.o.  Subjective Reason for consult: Follow-up assessment; Primapara; 1st time breastfeeding; NICU baby; Late-preterm 34-36.6wks; Maternal endocrine disorder  Visited with mom of 53 7/34 weeks old (adjusted) NICU female, she's a P1 and requested a feeding assist for the 3 pm feeding today. Baby "Angela Meyer" was asleep by the time [facility] and NICU RN shadow Angela Meyer came in the room; mom reported that he just had his Hep B shot and he was very sleepy, when tried to gently arouse baby, he'd start crying and then go back to sleep. Ms. Brendlinger asked this LC to come back tomorrow, she'll call for assistance when baby is ready and cueing.   Objective Infant data: Mother's Current Feeding Choice: Breast Milk and Formula  Infant feeding assessment Scale for Readiness: 2 Scale for Quality: 3  Maternal data: G1P0101  C-Section, Low Transverse Pumping frequency: 4 times/24 hours Pumped volume: 120 mL Flange Size: 24 Pump: Stork Pump  Assessment Infant: LATCH Score: 5  Maternal: Milk volume: Normal  Intervention/Plan Interventions: Education  Tools: Pump; Flanges Pump Education: Setup, frequency, and cleaning; Milk Storage  Plan of care: Encouraged mom to try pumping every 3 hours, or as close as she could get to 8 pumping sessions/24 hours She'll power pump in the AM She'll continue taking baby to breast on feeding cues around feeding times, she'd like to try a NS # 20 on the next feeding   No other support person at this time. All questions and concerns answered, family to contact [facility] services PRN.  Consult Status: NICU follow-up NICU Follow-up type: Assist with IDF-2 (Mother does not need to pre-pump before breastfeeding)    Tyronza Happe Venetia Constable 12/31/2021, 5:27 PM

## 2022-01-01 ENCOUNTER — Ambulatory Visit: Payer: Self-pay

## 2022-01-01 NOTE — Lactation Note (Signed)
This note was copied from a baby's chart. Lactation Consultation Note  Patient Name: Angela Meyer Date: 01/01/2022   Age:28 wk.o.  LC in the room to visit with mom for the 12 pm feeding but Angela Meyer hasn't arrived yet. Asked NICU RN to page lactation if mom needed assistance with another feeding today.  Feeding Nipple Type: Dr. Levert Feinstein Preemie   Angela Meyer 01/01/2022, 12:30 PM

## 2022-04-19 ENCOUNTER — Emergency Department (HOSPITAL_BASED_OUTPATIENT_CLINIC_OR_DEPARTMENT_OTHER): Payer: 59

## 2022-04-19 ENCOUNTER — Emergency Department (HOSPITAL_BASED_OUTPATIENT_CLINIC_OR_DEPARTMENT_OTHER)
Admission: EM | Admit: 2022-04-19 | Discharge: 2022-04-19 | Discharge status: Against Medical Advice | Payer: 59 | Attending: Emergency Medicine | Admitting: Emergency Medicine

## 2022-04-19 ENCOUNTER — Encounter (HOSPITAL_COMMUNITY): Payer: Self-pay

## 2022-04-19 ENCOUNTER — Other Ambulatory Visit: Payer: Self-pay

## 2022-04-19 ENCOUNTER — Encounter (HOSPITAL_BASED_OUTPATIENT_CLINIC_OR_DEPARTMENT_OTHER): Payer: Self-pay

## 2022-04-19 DIAGNOSIS — Z794 Long term (current) use of insulin: Secondary | ICD-10-CM | POA: Insufficient documentation

## 2022-04-19 DIAGNOSIS — R000 Tachycardia, unspecified: Secondary | ICD-10-CM | POA: Diagnosis not present

## 2022-04-19 DIAGNOSIS — E0590 Thyrotoxicosis, unspecified without thyrotoxic crisis or storm: Secondary | ICD-10-CM | POA: Diagnosis not present

## 2022-04-19 DIAGNOSIS — E039 Hypothyroidism, unspecified: Secondary | ICD-10-CM | POA: Diagnosis not present

## 2022-04-19 DIAGNOSIS — E1110 Type 2 diabetes mellitus with ketoacidosis without coma: Secondary | ICD-10-CM | POA: Diagnosis present

## 2022-04-19 DIAGNOSIS — E0810 Diabetes mellitus due to underlying condition with ketoacidosis without coma: Secondary | ICD-10-CM | POA: Insufficient documentation

## 2022-04-19 DIAGNOSIS — Z20822 Contact with and (suspected) exposure to covid-19: Secondary | ICD-10-CM | POA: Insufficient documentation

## 2022-04-19 DIAGNOSIS — E1010 Type 1 diabetes mellitus with ketoacidosis without coma: Secondary | ICD-10-CM

## 2022-04-19 DIAGNOSIS — R739 Hyperglycemia, unspecified: Secondary | ICD-10-CM | POA: Diagnosis present

## 2022-04-19 LAB — URINALYSIS, ROUTINE W REFLEX MICROSCOPIC
Bilirubin Urine: NEGATIVE
Glucose, UA: >1000 mg/dL — AB
Hgb urine dipstick: NEGATIVE
Ketones, ur: >80 mg/dL — AB
Leukocytes,Ua: NEGATIVE
Nitrite: NEGATIVE
Protein, ur: NEGATIVE mg/dL
Specific Gravity, Urine: 1.018 (ref 1.005–1.030)
pH: 5 (ref 5.0–8.0)

## 2022-04-19 LAB — CBC WITH DIFFERENTIAL/PLATELET
Abs Immature Granulocytes: 0.02 10*3/uL (ref 0.00–0.07)
Basophils Absolute: 0.1 10*3/uL (ref 0.0–0.1)
Basophils Relative: 1 %
Eosinophils Absolute: 0 10*3/uL (ref 0.0–0.5)
Eosinophils Relative: 0 %
HCT: 38.9 % (ref 36.0–46.0)
Hemoglobin: 12.7 g/dL (ref 12.0–15.0)
Immature Granulocytes: 0 %
Lymphocytes Relative: 13 %
Lymphs Abs: 0.9 10*3/uL (ref 0.7–4.0)
MCH: 26.8 pg (ref 26.0–34.0)
MCHC: 32.6 g/dL (ref 30.0–36.0)
MCV: 82.1 fL (ref 80.0–100.0)
Monocytes Absolute: 0.9 10*3/uL (ref 0.1–1.0)
Monocytes Relative: 14 %
Neutro Abs: 5 10*3/uL (ref 1.7–7.7)
Neutrophils Relative %: 72 %
Platelets: 299 10*3/uL (ref 150–400)
RBC: 4.74 MIL/uL (ref 3.87–5.11)
RDW: 12.9 % (ref 11.5–15.5)
WBC: 6.9 10*3/uL (ref 4.0–10.5)
nRBC: 0 % (ref 0.0–0.2)

## 2022-04-19 LAB — COMPREHENSIVE METABOLIC PANEL
ALT: 77 U/L — ABNORMAL HIGH (ref 0–44)
AST: 47 U/L — ABNORMAL HIGH (ref 15–41)
Albumin: 4.6 g/dL (ref 3.5–5.0)
Alkaline Phosphatase: 61 U/L (ref 38–126)
Anion gap: 19 — ABNORMAL HIGH (ref 5–15)
BUN: 22 mg/dL — ABNORMAL HIGH (ref 6–20)
CO2: 19 mmol/L — ABNORMAL LOW (ref 22–32)
Calcium: 10 mg/dL (ref 8.9–10.3)
Chloride: 102 mmol/L (ref 98–111)
Creatinine, Ser: 0.55 mg/dL (ref 0.44–1.00)
GFR, Estimated: >60 mL/min (ref >60)
Glucose, Bld: 287 mg/dL — ABNORMAL HIGH (ref 70–99)
Potassium: 5.1 mmol/L (ref 3.5–5.1)
Sodium: 140 mmol/L (ref 135–145)
Total Bilirubin: 0.6 mg/dL (ref 0.3–1.2)
Total Protein: 7.5 g/dL (ref 6.5–8.1)

## 2022-04-19 LAB — I-STAT VENOUS BLOOD GAS, ED
Acid-base deficit: 6 mmol/L — ABNORMAL HIGH (ref 0.0–2.0)
Bicarbonate: 19.1 mmol/L — ABNORMAL LOW (ref 20.0–28.0)
Calcium, Ion: 1.26 mmol/L (ref 1.15–1.40)
HCT: 38 % (ref 36.0–46.0)
Hemoglobin: 12.9 g/dL (ref 12.0–15.0)
O2 Saturation: 90 %
Patient temperature: 98.5
Potassium: 5 mmol/L (ref 3.5–5.1)
Sodium: 139 mmol/L (ref 135–145)
TCO2: 20 mmol/L — ABNORMAL LOW (ref 22–32)
pCO2, Ven: 34 mmHg — ABNORMAL LOW (ref 44–60)
pH, Ven: 7.357 (ref 7.25–7.43)
pO2, Ven: 60 mmHg — ABNORMAL HIGH (ref 32–45)

## 2022-04-19 LAB — T4, FREE: Free T4: >5.5 ng/dL — ABNORMAL HIGH (ref 0.61–1.12)

## 2022-04-19 LAB — BASIC METABOLIC PANEL
Anion gap: 13 (ref 5–15)
BUN: 19 mg/dL (ref 6–20)
CO2: 21 mmol/L — ABNORMAL LOW (ref 22–32)
Calcium: 9.6 mg/dL (ref 8.9–10.3)
Chloride: 106 mmol/L (ref 98–111)
Creatinine, Ser: 0.54 mg/dL (ref 0.44–1.00)
GFR, Estimated: >60 mL/min (ref >60)
Glucose, Bld: 216 mg/dL — ABNORMAL HIGH (ref 70–99)
Potassium: 4 mmol/L (ref 3.5–5.1)
Sodium: 140 mmol/L (ref 135–145)

## 2022-04-19 LAB — CBG MONITORING, ED
Glucose-Capillary: 284 mg/dL — ABNORMAL HIGH (ref 70–99)
Glucose-Capillary: 269 mg/dL — ABNORMAL HIGH (ref 70–99)
Glucose-Capillary: 214 mg/dL — ABNORMAL HIGH (ref 70–99)
Glucose-Capillary: 198 mg/dL — ABNORMAL HIGH (ref 70–99)
Glucose-Capillary: 193 mg/dL — ABNORMAL HIGH (ref 70–99)
Glucose-Capillary: 189 mg/dL — ABNORMAL HIGH (ref 70–99)

## 2022-04-19 LAB — TSH: TSH: <0.01 u[IU]/mL — ABNORMAL LOW (ref 0.350–4.500)

## 2022-04-19 LAB — SARS CORONAVIRUS 2 BY RT PCR: SARS Coronavirus 2 by RT PCR: NEGATIVE

## 2022-04-19 LAB — PREGNANCY, URINE: Preg Test, Ur: NEGATIVE

## 2022-04-19 LAB — BETA-HYDROXYBUTYRIC ACID: Beta-Hydroxybutyric Acid: 2.27 mmol/L — ABNORMAL HIGH (ref 0.05–0.27)

## 2022-04-19 LAB — LIPASE, BLOOD: Lipase: <10 U/L — ABNORMAL LOW (ref 11–51)

## 2022-04-19 MED ORDER — LACTATED RINGERS IV SOLN: INTRAVENOUS | Status: DC

## 2022-04-19 MED ORDER — METHIMAZOLE 5 MG PO TABS: 5.0000 mg | ORAL_TABLET | Freq: Three times a day (TID) | ORAL | Status: DC

## 2022-04-19 MED ORDER — DEXTROSE IN LACTATED RINGERS 5 % IV SOLN: INTRAVENOUS | Status: DC

## 2022-04-19 MED ORDER — LACTATED RINGERS IV BOLUS
20.0000 mL/kg | Freq: Once | INTRAVENOUS | Status: AC
Start: 2022-04-19 — End: 2022-04-19
  Administered 2022-04-19: 1270 mL via INTRAVENOUS

## 2022-04-19 MED ORDER — DEXTROSE 50 % IV SOLN: 0.0000 mL | INTRAVENOUS | Status: DC | PRN

## 2022-04-19 MED ORDER — PROPRANOLOL HCL 1 MG/ML IV SOLN
0.5000 mg | Freq: Once | INTRAVENOUS | Status: AC
  Administered 2022-04-19: 0.5 mg via INTRAVENOUS
  Filled 2022-04-19: qty 1

## 2022-04-19 MED ORDER — ONDANSETRON HCL 4 MG/2ML IJ SOLN
4.0000 mg | Freq: Once | INTRAMUSCULAR | Status: AC
  Administered 2022-04-19: 4 mg via INTRAVENOUS
  Filled 2022-04-19: qty 2

## 2022-04-19 MED ORDER — INSULIN REGULAR(HUMAN) IN NACL 100-0.9 UT/100ML-% IV SOLN
INTRAVENOUS | Status: DC
  Administered 2022-04-19: 3.6 [IU]/h via INTRAVENOUS
  Administered 2022-04-19: 4 [IU]/h via INTRAVENOUS
  Administered 2022-04-19: 3.8 [IU]/h via INTRAVENOUS
  Administered 2022-04-19: 4.2 [IU]/h via INTRAVENOUS
  Administered 2022-04-19: 7.5 [IU]/h via INTRAVENOUS
  Filled 2022-04-19: qty 100

## 2022-04-19 NOTE — ED Triage Notes (Signed)
Pt reports her BG has been in the 300s-400s the past couple of days. Pt c/o nausea, vomiting, and generalized abdominal pain. 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 Reports dark black vomit.

## 2022-04-19 NOTE — ED Notes (Signed)
Patient wanting to leave AMA  Dr Tomi Bamberger spoke with patient.  This Probation officer also reviewed risks of leaving and encouraged to return if gets worse.

## 2022-04-19 NOTE — ED Provider Notes (Signed)
[facility] EMERGENCY DEPT Provider Note   CSN: [id number] Arrival date & time: 04/19/22  0745     History  Chief Complaint  Patient presents with   Hyperglycemia   Nausea   Emesis    Angela Meyer is a 26 y.o. female.   Hyperglycemia Associated symptoms: abdominal pain and vomiting   Emesis Associated symptoms: abdominal pain   Abdominal Pain Associated symptoms: vomiting      Patient presents to the ED for evaluation of nausea vomiting, diarrhea and elevated blood sugar.  Patient has a history of hypothyroidism, diabetes, iron deficiency anemia, preeclampsia.  Patient has had DKA in the past.  Patient states for the past week or so her sugars have been running high in the 200s to 300s.  She saw her primary care doctor and was notified that her A1c was elevated.  Patient states she did not have any change in her medication regimen.  Today she started having vomiting and diarrhea.  She has had several episodes.  Her emesis was also dark.  Patient states she has been in DKA in the past and feels this is similar.  She denies any abdominal pain.  She is not having any shortness of breath.  No fevers.  No dysuria.  She is having a slight cough.  Home Medications Prior to Admission medications   Medication Sig Start Date End Date Taking? Authorizing Provider  ibuprofen (ADVIL) 600 MG tablet Take 1 tablet (600 mg total) by mouth every 6 (six) hours. 12/21/21   Charyl Bigger, MD  insulin lispro (HUMALOG) 100 UNIT/ML injection Inject 0.04-0.12 mLs (4-12 Units total) into the skin 3 (three) times daily before meals. 12/21/21   Charyl Bigger, MD  labetalol (NORMODYNE) 300 MG tablet Take 1 tablet (300 mg total) by mouth 3 (three) times daily. 12/21/21   Charyl Bigger, MD  NIFEdipine (ADALAT CC) 30 MG 24 hr tablet Take 1 tablet (30 mg total) by mouth at bedtime. 12/21/21   Charyl Bigger, MD  NIFEdipine (ADALAT CC) 60 MG 24 hr tablet Take 1 tablet (60 mg total) by  mouth daily. 12/22/21   Charyl Bigger, MD  oxyCODONE (OXY IR/ROXICODONE) 5 MG immediate release tablet Take 1-2 tablets (5-10 mg total) by mouth every 4 (four) hours as needed for moderate pain. 12/21/21   Charyl Bigger, MD  prenatal vitamin w/FE, FA (PRENATAL 1 + 1) 27-1 MG TABS tablet Take 1 tablet by mouth daily at 12 noon.    [provider]  TOUJEO MAX SOLOSTAR 300 UNIT/ML Solostar Pen Inject 21 Units into the skin at bedtime. 12/21/21   Charyl Bigger, MD      Allergies    Patient has no known allergies.    Review of Systems   Review of Systems  Gastrointestinal:  Positive for abdominal pain and vomiting.    Physical Exam Updated Vital Signs BP 122/78   Pulse (!) 119   Temp 98.5 F (36.9 C) (Oral)   Resp 19   Ht 1.575 m (5\' 2" )   Wt 63.5 kg   SpO2 99%   BMI 25.61 kg/m  Physical Exam Vitals and nursing note reviewed.  Constitutional:      Appearance: She is well-developed. She is not diaphoretic.  HENT:     Head: Normocephalic and atraumatic.     Right Ear: External ear normal.     Left Ear: External ear normal.  Eyes:     General: No scleral icterus.  Right eye: No discharge.        Left eye: No discharge.     Conjunctiva/sclera: Conjunctivae normal.  Neck:     Trachea: No tracheal deviation.  Cardiovascular:     Rate and Rhythm: Regular rhythm. Tachycardia present.  Pulmonary:     Effort: Pulmonary effort is normal. No respiratory distress.     Breath sounds: Normal breath sounds. No stridor. No wheezing or rales.  Abdominal:     General: Bowel sounds are normal. There is no distension.     Palpations: Abdomen is soft.     Tenderness: There is no abdominal tenderness. There is no guarding or rebound.  Musculoskeletal:        General: No tenderness or deformity.     Cervical back: Neck supple.  Skin:    General: Skin is warm and dry.     Findings: No rash.  Neurological:     General: No focal deficit present.     Mental Status: She is  alert.     Cranial Nerves: No cranial nerve deficit (no facial droop, extraocular movements intact, no slurred speech).     Sensory: No sensory deficit.     Motor: No abnormal muscle tone or seizure activity.     Coordination: Coordination normal.  Psychiatric:        Mood and Affect: Mood normal.     ED Results / Procedures / Treatments   Labs (all labs ordered are listed, but only abnormal results are displayed) Labs Reviewed  BETA-HYDROXYBUTYRIC ACID - Abnormal; Notable for the following components:      Result Value   Beta-Hydroxybutyric Acid 2.27 (*)    All other components within normal limits  URINALYSIS, ROUTINE W REFLEX MICROSCOPIC - Abnormal; Notable for the following components:   Color, Urine COLORLESS (*)    Glucose, UA >1,000 (*)    Ketones, ur >80 (*)    All other components within normal limits  COMPREHENSIVE METABOLIC PANEL - Abnormal; Notable for the following components:   CO2 19 (*)    Glucose, Bld 287 (*)    BUN 22 (*)    AST 47 (*)    ALT 77 (*)    Anion gap 19 (*)    All other components within normal limits  LIPASE, BLOOD - Abnormal; Notable for the following components:   Lipase <10 (*)    All other components within normal limits  TSH - Abnormal; Notable for the following components:   TSH <0.010 (*)    All other components within normal limits  CBG MONITORING, ED - Abnormal; Notable for the following components:   Glucose-Capillary 284 (*)    All other components within normal limits  I-STAT VENOUS BLOOD GAS, ED - Abnormal; Notable for the following components:   pCO2, Ven 34.0 (*)    pO2, Ven 60 (*)    Bicarbonate 19.1 (*)    TCO2 20 (*)    Acid-base deficit 6.0 (*)    All other components within normal limits  CBG MONITORING, ED - Abnormal; Notable for the following components:   Glucose-Capillary 269 (*)    All other components within normal limits  SARS CORONAVIRUS 2 BY RT PCR  PREGNANCY, URINE  CBC WITH DIFFERENTIAL/PLATELET   BETA-HYDROXYBUTYRIC ACID  T4, FREE  T4  BASIC METABOLIC PANEL    EKG None  Radiology DG Chest Portable 1 View  Result Date: 04/19/2022 CLINICAL DATA:  Cough. EXAM: PORTABLE CHEST 1 VIEW COMPARISON:  01/29/2014 FINDINGS: Heart size  and mediastinal contours appear normal. No pleural effusion or edema identified. No airspace opacities identified. Visualized osseous structures are unremarkable. IMPRESSION: No active disease. Electronically Signed   By: Kerby Moors M.D.   On: 04/19/2022 08:45    Procedures .Critical Care  Performed by: Dorie Rank, MD Authorized by: Dorie Rank, MD   Critical care provider statement:    Critical care time (minutes):  45   Critical care was time spent personally by me on the following activities:  Development of treatment plan with patient or surrogate, discussions with consultants, evaluation of patient's response to treatment, examination of patient, ordering and review of laboratory studies, ordering and review of radiographic studies, ordering and performing treatments and interventions, pulse oximetry, re-evaluation of patient's condition and review of old charts     Medications Ordered in ED Medications  insulin regular, human (MYXREDLIN) 100 units/ 100 mL infusion (4.2 Units/hr Intravenous New Bag/Given 04/19/22 1052)  lactated ringers infusion ( Intravenous New Bag/Given 04/19/22 0955)  dextrose 5 % in lactated ringers infusion ( Intravenous New Bag/Given 04/19/22 1051)  dextrose 50 % solution 0-50 mL (has no administration in time range)  methimazole (TAPAZOLE) tablet 5 mg (has no administration in time range)  lactated ringers bolus 1,270 mL (0 mLs Intravenous Stopped 04/19/22 0954)  ondansetron (ZOFRAN) injection 4 mg (4 mg Intravenous Given 04/19/22 0820)  propranolol (INDERAL) injection 0.5 mg (0.5 mg Intravenous Given 04/19/22 1053)    ED Course/ Medical Decision Making/ A&P Clinical Course as of 04/19/22 1054  Sat Apr 19, 2022   0849 I-Stat venous blood gas, ED(!) H is normal [JK]  0850 CBC with Differential (PNL) CBC normal [JK]  0850 X-ray without acute findings [JK]  0905 Comprehensive metabolic panel(!) Bicarb is decreased with elevated anion gap [JK]  [id number] Metabolic panel does suggest DKA with elevated anion gap and decreased bicarb.  pH normal however there is decreased PCO2 suggesting respiratory compensation [JK]  1024 Urinalysis, Routine w reflex microscopic Anterior Nasal Swab(!) Terms elevated in the urine [JK]  1024 Beta-hydroxybutyric acid(!) Beta hydroxybutyric acid elevated [JK]  1024 TSH(!) TSH very decreased [JK]  1050 Case discussed with Dr Marylyn Ishihara regarding admission.  We will start the patient back on methimazole while waiting for admission [JK]    Clinical Course User Index [JK] Dorie Rank, MD                           Medical Decision Making Problems Addressed: Diabetic ketoacidosis without coma associated with type 1 diabetes mellitus ([place]): acute illness or injury that poses a threat to life or bodily functions Thyrotoxicosis without thyroid storm, unspecified thyrotoxicosis type: acute illness or injury that poses a threat to life or bodily functions  Amount and/or Complexity of Data Reviewed Labs: ordered. Decision-making details documented in ED Course. Radiology: ordered and independent interpretation performed.  Risk Prescription drug management. Drug therapy requiring intensive monitoring for toxicity. Decision regarding hospitalization.   Patient presented to the ED with concerns of recurrent DKA.  Patient's had elevated blood sugar as well as nausea vomiting.  Patient's abdominal exam is benign.  No tenderness to suggest hepatitis pancreatitis cholecystitis appendicitis.  Labs are consistent with DKA with elevated anion gap and elevated ketones.  Patient has been started on IV fluids and insulin infusion.  We will continue to monitor closely.  She also has history of  Graves' disease.  Patient states she stopped treatment during pregnancy because her levels normalized.  Patient previously had just been treated with methimazole.  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  Will add on T3 and T4 but her persistent tachycardia and low TSH is consistent with hyperthyroidism.  Nontoxic-appearing.  We will start IV propranolol.  Patient will require admission to the hospital for further treatment.        Final Clinical Impression(s) / ED Diagnoses Final diagnoses:  Diabetic ketoacidosis without coma associated with type 1 diabetes mellitus ([place])  Thyrotoxicosis without thyroid storm, unspecified thyrotoxicosis type    Rx / DC Orders ED Discharge Orders     None         Dorie Rank, MD 04/19/22 1054

## 2022-04-19 NOTE — Progress Notes (Signed)
Plan of Care Note for accepted transfer   Patient: [place] A Buhrman MRN: [id number]   DOA: 04/19/2022  Facility requesting transfer: [facility] Requesting Provider: Dr. Tomi Bamberger Reason for transfer: DKA Facility course: 26 yo F w/ history of DM1, HTN, hyperthyroidism. Presenting with N/V/D. W/u revealed elevated anion gap, metabolic acidosis, tachycardia, elevated beta-hydroxybutyric acid. Also her TSH was <0.010. Concern for DKA and thyroidtoxicosis (not full thyroid storm: no AMS, fever, HF). She was started on Endotool and propanolol. EDP to start methimazole.    Plan of care: The patient is accepted for admission to [facility] unit, at [facility]. While holding at [facility], medical decision making responsibilities remain with the [place]. Upon arrival to [facility], [facility] will assume care. Thank you.  Author: Jonnie Finner, DO 04/19/2022  Check [email or web address] for on-call coverage.  Nursing staff, Please call [place] number on Amion as soon as patient's arrival, so appropriate admitting provider can evaluate the pt.

## 2022-04-19 NOTE — ED Provider Notes (Signed)
Patient was seen by me earlier this morning.  Patient was found to be in DKA as well as thyrotoxicosis.  Treatment was initiated and plan was for hospital admission.  Patient was still holding in the ED at 3:30 PM waiting for an inpatient bed.  Patient informed nursing staff that she did not want to stay in the hospital and wanted to leave.  I explained to the patient that I still felt it was appropriate for her to be admitted to the hospital.  Her heart rate is 130 at the bedside.  She is at risk for decompensation.  I explained to her that her illnesses can be life-threatening.  I am concerned that she will get worse over the weekend before she is able to see her doctor.  I do not recommend going home.  Patient however is alert and states she is aware of the risks.  She states she has been in the hospital before.  She has decided to leave [place], Letoya Stallone, MD 04/19/22 [phone]

## 2022-04-20 LAB — T4: T4, Total: >24.9 ug/dL — ABNORMAL HIGH (ref 4.5–12.0)

## 2022-08-21 ENCOUNTER — Encounter (HOSPITAL_BASED_OUTPATIENT_CLINIC_OR_DEPARTMENT_OTHER): Payer: Self-pay | Admitting: Emergency Medicine

## 2022-08-21 ENCOUNTER — Other Ambulatory Visit: Payer: Self-pay

## 2022-08-21 ENCOUNTER — Emergency Department (HOSPITAL_BASED_OUTPATIENT_CLINIC_OR_DEPARTMENT_OTHER)
Admission: EM | Admit: 2022-08-21 | Discharge: 2022-08-21 | Disposition: A | Discharge status: Home / Self Care | Payer: 59 | Attending: Emergency Medicine | Admitting: Emergency Medicine

## 2022-08-21 DIAGNOSIS — E1065 Type 1 diabetes mellitus with hyperglycemia: Secondary | ICD-10-CM | POA: Insufficient documentation

## 2022-08-21 DIAGNOSIS — E860 Dehydration: Secondary | ICD-10-CM

## 2022-08-21 DIAGNOSIS — E039 Hypothyroidism, unspecified: Secondary | ICD-10-CM | POA: Diagnosis not present

## 2022-08-21 DIAGNOSIS — R519 Headache, unspecified: Secondary | ICD-10-CM

## 2022-08-21 DIAGNOSIS — R739 Hyperglycemia, unspecified: Secondary | ICD-10-CM | POA: Diagnosis present

## 2022-08-21 LAB — CBC
HCT: 36.8 % (ref 36.0–46.0)
Hemoglobin: 12.1 g/dL (ref 12.0–15.0)
MCH: 25.7 pg — ABNORMAL LOW (ref 26.0–34.0)
MCHC: 32.9 g/dL (ref 30.0–36.0)
MCV: 78.1 fL — ABNORMAL LOW (ref 80.0–100.0)
Platelets: 515 10*3/uL — ABNORMAL HIGH (ref 150–400)
RBC: 4.71 MIL/uL (ref 3.87–5.11)
RDW: 12.9 % (ref 11.5–15.5)
WBC: 6.8 10*3/uL (ref 4.0–10.5)
nRBC: 0 % (ref 0.0–0.2)

## 2022-08-21 LAB — BASIC METABOLIC PANEL
Anion gap: 9 (ref 5–15)
BUN: 9 mg/dL (ref 6–20)
CO2: 24 mmol/L (ref 22–32)
Calcium: 9.7 mg/dL (ref 8.9–10.3)
Chloride: 101 mmol/L (ref 98–111)
Creatinine, Ser: 0.42 mg/dL — ABNORMAL LOW (ref 0.44–1.00)
GFR, Estimated: >60 mL/min (ref >60)
Glucose, Bld: 244 mg/dL — ABNORMAL HIGH (ref 70–99)
Potassium: 4 mmol/L (ref 3.5–5.1)
Sodium: 134 mmol/L — ABNORMAL LOW (ref 135–145)

## 2022-08-21 LAB — CBG MONITORING, ED
Glucose-Capillary: 244 mg/dL — ABNORMAL HIGH (ref 70–99)
Glucose-Capillary: 158 mg/dL — ABNORMAL HIGH (ref 70–99)

## 2022-08-21 MED ORDER — SODIUM CHLORIDE 0.9 % IV BOLUS
1000.0000 mL | Freq: Once | INTRAVENOUS | Status: AC
  Administered 2022-08-21: 1000 mL via INTRAVENOUS

## 2022-08-21 MED ORDER — KETOROLAC TROMETHAMINE 30 MG/ML IJ SOLN
15.0000 mg | Freq: Once | INTRAMUSCULAR | Status: AC
  Administered 2022-08-21: 15 mg via INTRAVENOUS
  Filled 2022-08-21: qty 1

## 2022-08-21 MED ORDER — ACETAMINOPHEN 500 MG PO TABS
1000.0000 mg | ORAL_TABLET | Freq: Once | ORAL | Status: AC
  Administered 2022-08-21: 1000 mg via ORAL
  Filled 2022-08-21: qty 2

## 2022-08-21 MED ORDER — DIPHENHYDRAMINE HCL 50 MG/ML IJ SOLN
12.5000 mg | Freq: Once | INTRAMUSCULAR | Status: AC
  Administered 2022-08-21: 12.5 mg via INTRAVENOUS
  Filled 2022-08-21: qty 1

## 2022-08-21 MED ORDER — KETOROLAC TROMETHAMINE 15 MG/ML IJ SOLN
15.0000 mg | Freq: Once | INTRAMUSCULAR | Status: AC
  Administered 2022-08-21: 15 mg via INTRAVENOUS
  Filled 2022-08-21: qty 1

## 2022-08-21 MED ORDER — PROCHLORPERAZINE EDISYLATE 10 MG/2ML IJ SOLN
10.0000 mg | Freq: Once | INTRAMUSCULAR | Status: AC
  Administered 2022-08-21: 10 mg via INTRAVENOUS
  Filled 2022-08-21: qty 2

## 2022-08-21 MED ORDER — KETOROLAC TROMETHAMINE 15 MG/ML IJ SOLN: 15.0000 mg | Freq: Once | INTRAMUSCULAR | Status: DC

## 2022-08-21 NOTE — ED Triage Notes (Signed)
Pt arrives to ED with c/o hyperglycemia. She notes blood sugars in the 400's over the last few days. She notes polyuria, polydipsia, eye pain, headache, and blurry vision. Hx T1DM.

## 2022-08-21 NOTE — ED Provider Notes (Signed)
[facility] Provider Note  CSN: [id number] Arrival date & time: 08/21/22 [id number]  Chief Complaint(s) Hyperglycemia  HPI Angela Meyer is a 27 y.o. female with history of type 1 diabetes presenting to the emergency department with hyperglycemia.  She reports that over the past few days she has had blood sugars ranging up to 400.  She reports polyuria, polydipsia.  She reports some mild headaches and blurry vision.  No shortness of breath, nausea or vomiting.  No abdominal pain.  Does not feel like previous episodes of DKA.  Symptoms mild.  Reports medication compliance.   Past Medical History Past Medical History:  Diagnosis Date   Chronic autoimmune thyroiditis    Diabetes mellitus    Dyspepsia    Fetal tachycardia before the onset of labor    Goiter with hyperthyroidism    Hyperthyroidism    Hypothyroidism    Irritability    Irritability    Tachycardia    Thyroiditis, autoimmune    Thyrotoxicosis with diffuse goiter    Tremor    Tremor    Weight loss    Weight loss, abnormal    Patient Active Problem List   Diagnosis Date Noted   DKA (diabetic ketoacidosis) ([place]) 04/19/2022   Status post primary low transverse cesarean section 6/26 12/16/2021   Postpartum care following cesarean delivery 6/26 12/16/2021   Severe preeclampsia 12/16/2021   Iron deficiency anemia 12/16/2021   Type 1 diabetes mellitus ([place]) 11/26/2010   Hyperthyroidism 10/14/2010   Home Medication(s) Prior to Admission medications   Medication Sig Start Date End Date Taking? Authorizing Provider  ibuprofen (ADVIL) 600 MG tablet Take 1 tablet (600 mg total) by mouth every 6 (six) hours. 12/21/21   Charyl Bigger, MD  insulin lispro (HUMALOG) 100 UNIT/ML injection Inject 0.04-0.12 mLs (4-12 Units total) into the skin 3 (three) times daily before meals. 12/21/21   Charyl Bigger, MD  labetalol (NORMODYNE) 300 MG tablet Take 1 tablet (300 mg total) by mouth 3  (three) times daily. 12/21/21   Charyl Bigger, MD  NIFEdipine (ADALAT CC) 30 MG 24 hr tablet Take 1 tablet (30 mg total) by mouth at bedtime. 12/21/21   Charyl Bigger, MD  NIFEdipine (ADALAT CC) 60 MG 24 hr tablet Take 1 tablet (60 mg total) by mouth daily. 12/22/21   Charyl Bigger, MD  oxyCODONE (OXY IR/ROXICODONE) 5 MG immediate release tablet Take 1-2 tablets (5-10 mg total) by mouth every 4 (four) hours as needed for moderate pain. 12/21/21   Charyl Bigger, MD  prenatal vitamin w/FE, FA (PRENATAL 1 + 1) 27-1 MG TABS tablet Take 1 tablet by mouth daily at 12 noon.    [provider]  TOUJEO MAX SOLOSTAR 300 UNIT/ML Solostar Pen Inject 21 Units into the skin at bedtime. 12/21/21   Charyl Bigger, MD  Past Surgical History Past Surgical History:  Procedure Laterality Date   CESAREAN SECTION N/A 12/16/2021   Procedure: CESAREAN SECTION;  Surgeon: Armandina Stammer, DO;  Location: [facility] LD ORS;  Service: Obstetrics;  Laterality: N/A;   NO PAST SURGERIES     Family History Family History  Problem Relation Age of Onset   Diabetes Father    Congenital heart disease Father    Diabetes Paternal Aunt    Thyroid disease Maternal Grandmother    Heart disease Maternal Grandfather     Social History Social History   Tobacco Use   Smoking status: Never   Smokeless tobacco: Never  Vaping Use   Vaping Use: Never used  Substance Use Topics   Alcohol use: Not Currently    Comment: not while preg   Drug use: No   Allergies Patient has no known allergies.  Review of Systems Review of Systems  All other systems reviewed and are negative.   Physical Exam Vital Signs  I have reviewed the triage vital signs BP 109/72 (BP Location: Right Arm)   Pulse 93   Temp 99.6 F (37.6 C) (Oral)   Resp 16   Ht '5\' 2"'$  (1.575 m)   Wt 61.2 kg   SpO2 99%   BMI  24.69 kg/m  Physical Exam Vitals and nursing note reviewed.  Constitutional:      General: She is not in acute distress.    Appearance: She is well-developed.  HENT:     Head: Normocephalic and atraumatic.     Mouth/Throat:     Mouth: Mucous membranes are dry.  Eyes:     Extraocular Movements: Extraocular movements intact.     Conjunctiva/sclera: Conjunctivae normal.     Pupils: Pupils are equal, round, and reactive to light.  Cardiovascular:     Rate and Rhythm: Normal rate and regular rhythm.     Heart sounds: No murmur heard. Pulmonary:     Effort: Pulmonary effort is normal. No respiratory distress.     Breath sounds: Normal breath sounds.  Abdominal:     General: Abdomen is flat.     Palpations: Abdomen is soft.     Tenderness: There is no abdominal tenderness.  Musculoskeletal:        General: No tenderness.     Right lower leg: No edema.     Left lower leg: No edema.  Skin:    General: Skin is warm and dry.  Neurological:     General: No focal deficit present.     Mental Status: She is alert. Mental status is at baseline.     Cranial Nerves: No cranial nerve deficit.     Comments: Cranial nerves II through XII intact, strength 5 out of 5 in the bilateral upper and lower extremities, no sensory deficit to light touch, no dysmetria on finger-nose-finger testing, ambulatory with steady gait.   Psychiatric:        Mood and Affect: Mood normal.        Behavior: Behavior normal.     ED Results and Treatments Labs (all labs ordered are listed, but only abnormal results are displayed) Labs Reviewed  BASIC METABOLIC PANEL - Abnormal; Notable for the following components:      Result Value   Sodium 134 (*)    Glucose, Bld 244 (*)    Creatinine, Ser 0.42 (*)    All other components within normal limits  CBC - Abnormal; Notable for the following components:   MCV 78.1 (*)  MCH 25.7 (*)    Platelets 515 (*)    All other components within normal limits  CBG  MONITORING, ED - Abnormal; Notable for the following components:   Glucose-Capillary 244 (*)    All other components within normal limits  CBG MONITORING, ED - Abnormal; Notable for the following components:   Glucose-Capillary 158 (*)    All other components within normal limits  URINALYSIS, ROUTINE W REFLEX MICROSCOPIC  PREGNANCY, URINE                                                                                                                          Radiology No results found.  Pertinent labs & imaging results that were available during my care of the patient were reviewed by me and considered in my medical decision making (see MDM for details).  Medications Ordered in ED Medications  sodium chloride 0.9 % bolus 1,000 mL (0 mLs Intravenous Stopped 08/21/22 0940)  acetaminophen (TYLENOL) tablet 1,000 mg (1,000 mg Oral Given 08/21/22 0841)  ketorolac (TORADOL) 15 MG/ML injection 15 mg (15 mg Intravenous Given 08/21/22 0841)  sodium chloride 0.9 % bolus 1,000 mL (1,000 mLs Intravenous New Bag/Given 08/21/22 0937)  prochlorperazine (COMPAZINE) injection 10 mg (10 mg Intravenous Given 08/21/22 0938)  diphenhydrAMINE (BENADRYL) injection 12.5 mg (12.5 mg Intravenous Given 08/21/22 0937)  ketorolac (TORADOL) 30 MG/ML injection 15 mg (15 mg Intravenous Given 08/21/22 [id number])                                                                                                                                     Procedures Procedures  (including critical care time)  Medical Decision Making / ED Course   MDM:  27 year old female presenting to the emergency department with hyperglycemia.  Patient well-appearing, vital signs normal.  Physical exam unremarkable including ocular /neurologic exam.  Point-of-care glucose is elevated to 244.  Labs reassuring without evidence of anion gap or other acute metabolic abnormality such as acute kidney injury.  Will give IV fluids, patient appears mildly  dehydrated.  No evidence of diabetic ketoacidosis.  Suspect blurry vision and headache is from mild dehydration and hyperglycemia.  Patient has no other focal symptoms to suggest alternative process.  No head trauma to suggest intracranial abnormality.  Will reassess.  Patient reports she has endocrinology appointment today.  If blood sugar improves will discharge for endocrinology appointment so they  can make adjustments to her insulin regimen.  Clinical Course as of 08/21/22 1024  Thu Aug 21, 2022  1021 Patient feels better after migraine cocktail.  Blood glucose improved after rehydration.  Suspect patient headache triggered by dehydration.  Her symptoms have all resolved.  She has a follow-up appointment today with her endocrinologist advised her to continue with this appointment. [WS]    Clinical Course User Index [WS] Truett Mainland Livingston Diones, MD     Additional history obtained: -Additional history obtained from spouse -External records from outside source obtained and reviewed including: Chart review including previous notes, labs, imaging, consultation notes including prior endocrinology notes.   Lab Tests: -I ordered, reviewed, and interpreted labs.   The pertinent results include:   Labs Reviewed  BASIC METABOLIC PANEL - Abnormal; Notable for the following components:      Result Value   Sodium 134 (*)    Glucose, Bld 244 (*)    Creatinine, Ser 0.42 (*)    All other components within normal limits  CBC - Abnormal; Notable for the following components:   MCV 78.1 (*)    MCH 25.7 (*)    Platelets 515 (*)    All other components within normal limits  CBG MONITORING, ED - Abnormal; Notable for the following components:   Glucose-Capillary 244 (*)    All other components within normal limits  CBG MONITORING, ED - Abnormal; Notable for the following components:   Glucose-Capillary 158 (*)    All other components within normal limits  URINALYSIS, ROUTINE W REFLEX MICROSCOPIC   PREGNANCY, URINE    Notable for elevated glucose, no anion gap or low bicarbonate   Medicines ordered and prescription drug management: Meds ordered this encounter  Medications   sodium chloride 0.9 % bolus 1,000 mL   acetaminophen (TYLENOL) tablet 1,000 mg   DISCONTD: ketorolac (TORADOL) 15 MG/ML injection 15 mg   ketorolac (TORADOL) 15 MG/ML injection 15 mg   sodium chloride 0.9 % bolus 1,000 mL   prochlorperazine (COMPAZINE) injection 10 mg   diphenhydrAMINE (BENADRYL) injection 12.5 mg   ketorolac (TORADOL) 30 MG/ML injection 15 mg    -I have reviewed the patients home medicines and have made adjustments as needed   Reevaluation: After the interventions noted above, I reevaluated the patient and found that their symptoms have resolved  Co morbidities that complicate the patient evaluation  Past Medical History:  Diagnosis Date   Chronic autoimmune thyroiditis    Diabetes mellitus    Dyspepsia    Fetal tachycardia before the onset of labor    Goiter with hyperthyroidism    Hyperthyroidism    Hypothyroidism    Irritability    Irritability    Tachycardia    Thyroiditis, autoimmune    Thyrotoxicosis with diffuse goiter    Tremor    Tremor    Weight loss    Weight loss, abnormal       Dispostion: Disposition decision including need for hospitalization was considered, and patient discharged from emergency department.    Final Clinical Impression(s) / ED Diagnoses Final diagnoses:  Nonintractable headache, unspecified chronicity pattern, unspecified headache type  Dehydration     This chart was dictated using voice recognition software.  Despite best efforts to proofread,  errors can occur which can change the documentation meaning.    Cristie Hem, MD 08/21/22 1024

## 2022-08-21 NOTE — Discharge Instructions (Signed)
We evaluated you for your headache and dehydration.  Your symptoms improved with treatment of migraine.  Your headache was likely triggered by dehydration and high blood sugars.  Please follow-up with your endocrinologist.  Please return to the emergency department if you develop any new or worsening symptoms such as recurrent or severe headaches, numbness or tingling, weakness, vision changes, speech changes, trouble walking, difficulty breathing, chest pain, or any other concerning symptoms.

## 2022-12-01 ENCOUNTER — Ambulatory Visit: Payer: 59 | Admitting: Internal Medicine

## 2022-12-01 ENCOUNTER — Encounter: Payer: Self-pay | Admitting: Internal Medicine

## 2022-12-01 VITALS — BP 122/76 | HR 60 | Ht 62.0 in | Wt 133.0 lb

## 2022-12-01 DIAGNOSIS — E0500 Thyrotoxicosis with diffuse goiter without thyrotoxic crisis or storm: Secondary | ICD-10-CM

## 2022-12-01 DIAGNOSIS — E1065 Type 1 diabetes mellitus with hyperglycemia: Secondary | ICD-10-CM

## 2022-12-01 DIAGNOSIS — E0590 Thyrotoxicosis, unspecified without thyrotoxic crisis or storm: Secondary | ICD-10-CM

## 2022-12-01 LAB — POCT GLYCOSYLATED HEMOGLOBIN (HGB A1C): Hemoglobin A1C: 11 % — AB (ref 4.0–5.6)

## 2022-12-01 LAB — TSH: TSH: 0.75 u[IU]/mL (ref 0.35–5.50)

## 2022-12-01 LAB — T4, FREE: Free T4: 0.69 ng/dL (ref 0.60–1.60)

## 2022-12-01 MED ORDER — OMNIPOD 5 DEXG7G6 INTRO GEN 5 KIT: 1.0000 | PACK | 0 refills | Status: DC

## 2022-12-01 MED ORDER — TOUJEO MAX SOLOSTAR 300 UNIT/ML ~~LOC~~ SOPN: 28.0000 [IU] | PEN_INJECTOR | Freq: Every day | SUBCUTANEOUS | 1 refills | Status: DC

## 2022-12-01 MED ORDER — METHIMAZOLE 10 MG PO TABS: 15.0000 mg | ORAL_TABLET | Freq: Every day | ORAL | 2 refills | Status: DC

## 2022-12-01 MED ORDER — INSULIN PEN NEEDLE 32G X 4 MM MISC: 1.0000 | Freq: Four times a day (QID) | 2 refills | Status: AC

## 2022-12-01 MED ORDER — DEXCOM G6 SENSOR MISC: 1.0000 | 2 refills | Status: DC

## 2022-12-01 MED ORDER — OMNIPOD 5 DEXG7G6 PODS GEN 5 MISC: 1.0000 | 2 refills | Status: DC

## 2022-12-01 MED ORDER — DEXCOM G6 TRANSMITTER MISC: 1.0000 | 2 refills | Status: DC

## 2022-12-01 NOTE — Progress Notes (Signed)
Name: Angela Meyer  MRN/ DOB: [id number], 21-Aug-1995   Age/ Sex: 27 y.o., female    PCP: Betsey Amen, MD   Reason for Endocrinology Evaluation: Type 1 Diabetes Mellitus/Hyperthyroidism     Date of Initial Endocrinology Visit: 12/01/2022     PATIENT IDENTIFIER: Angela Meyer is a 27 y.o. female with a past medical history of DM. The patient presented for initial endocrinology clinic visit on 12/01/2022 for consultative assistance with her diabetes management.    HPI: Angela Meyer was    Diagnosed with DM at age 54 Prior Medications tried/Intolerance: She was on a pump early in her diagnosis until age 39  Currently checking blood sugars multiple x / day Hypoglycemia episodes : no                Hemoglobin A1c has ranged from 5.2% in 2012, peaking at 11.0% in 2024.   In terms of diet, the patient eats 3 meals a day, grazed, avoids sugar sweetened beverages   She denies nausea, vomiting Denies constipation diarrhea Denies tingling of the feet   THYROID HISTORY: Patient has been noted with hyperthyroid since 2008, thyroid uptake and scan 04/2007 showed homogenous intense uptake of the thyroid at 96% . Her most recent TSH was suppressed 0.01 u IU/mL on 04/11/2022, with elevated free T4 >5.5 NG/DL.   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 Shawnee Knapp  She already has a Dexcom and OmniPod at home , would like to get training on it     HOME DIABETES REGIMEN: Toujeo 28 units daily  Humalog 12 - 15 units TIDQAC Methimazole 10 mg BID    Statin: no ACE-I/ARB: no    CONTINUOUS GLUCOSE MONITORING RECORD INTERPRETATION    Dates of Recording: 5/20 - 11/23/2022  Sensor description: [employer]  Results statistics:   CGM use % of time  69  Average and SD 288/30  Time in range    15    %  % Time Above 180 16  % Time above 250 69  % Time Below target 0   Glycemic patterns summary: BG's trend down overnight and increase during the day  Hyperglycemic episodes throughout the day  Hypoglycemic episodes occurred n/a  Overnight periods: Trendsdown    DIABETIC COMPLICATIONS: Microvascular complications:   Denies: CKD Last eye exam: Completed   Macrovascular complications:   Denies: CAD, PVD, CVA   PAST HISTORY: Past Medical History:  Past Medical History:  Diagnosis Date   Chronic autoimmune thyroiditis    Diabetes mellitus    Dyspepsia    Fetal tachycardia before the onset of labor    Goiter with hyperthyroidism    Hyperthyroidism    Hypothyroidism    Irritability    Irritability    Tachycardia    Thyroiditis, autoimmune    Thyrotoxicosis with diffuse goiter    Tremor    Tremor    Weight loss    Weight loss, abnormal    Past Surgical History:  Past Surgical History:  Procedure Laterality Date   CESAREAN SECTION N/A 12/16/2021   Procedure: CESAREAN SECTION;  Surgeon: Clance Boll A, DO;  Location: [facility];  Service: Obstetrics;  Laterality: N/A;  NO PAST SURGERIES      Social History:  reports that she has never smoked. She has never used smokeless tobacco. She reports that she does not currently use alcohol. She reports that she does not use drugs. Family History:  Family History  Problem Relation Age of Onset   Diabetes Father    Congenital heart disease Father    Diabetes Paternal Aunt    Thyroid disease Maternal Grandmother    Heart disease Maternal Grandfather      HOME MEDICATIONS: Allergies as of 12/01/2022   No Known Allergies      Medication List        Accurate as of December 01, 2022 10:12 AM. If you have any questions, ask your nurse or doctor.          ibuprofen 600 MG tablet Commonly known as: ADVIL Take 1 tablet (600 mg total) by mouth every 6 (six) hours.    insulin lispro 100 UNIT/ML injection Commonly known as: HUMALOG Inject 0.04-0.12 mLs (4-12 Units total) into the skin 3 (three) times daily before meals.   labetalol 300 MG tablet Commonly known as: NORMODYNE Take 1 tablet (300 mg total) by mouth 3 (three) times daily.   NIFEdipine 30 MG 24 hr tablet Commonly known as: ADALAT CC Take 1 tablet (30 mg total) by mouth at bedtime.   NIFEdipine 60 MG 24 hr tablet Commonly known as: ADALAT CC Take 1 tablet (60 mg total) by mouth daily.   oxyCODONE 5 MG immediate release tablet Commonly known as: Oxy IR/ROXICODONE Take 1-2 tablets (5-10 mg total) by mouth every 4 (four) hours as needed for moderate pain.   prenatal vitamin w/FE, FA 27-1 MG Tabs tablet Take 1 tablet by mouth daily at 12 noon.   Toujeo Max SoloStar 300 UNIT/ML Solostar Pen Generic drug: insulin glargine (2 Unit Dial) Inject 21 Units into the skin at bedtime.         ALLERGIES: No Known Allergies   REVIEW OF SYSTEMS: A comprehensive ROS was conducted with the patient and is negative except as per HPI and below:  ROS    OBJECTIVE:   VITAL SIGNS: BP 122/76 (BP Location: Left Arm, Patient Position: Sitting, Cuff Size: Small)   Pulse 60   Ht 5\' 2"  (1.575 m)   Wt 133 lb (60.3 kg)   SpO2 99%   BMI 24.33 kg/m    PHYSICAL EXAM:  General: Pt appears well and is in NAD  Neck: General: Supple without adenopathy or carotid bruits. Thyroid: Thyroid size normal.  No goiter or nodules appreciated.   Lungs: Clear with good BS bilat   Heart: RRR   Abdomen:  soft, nontender  Extremities:  Lower extremities - No pretibial edema.   Neuro: MS is good with appropriate affect, pt is alert and Ox3    DM foot exam: 12/01/2022  The skin of the feet is intact without sores or ulcerations. The pedal pulses are 2+ on right and 2+ on left. The sensation is intact to a screening 5.07, 10 gram monofilament bilaterally    DATA REVIEWED:  Lab Results  Component Value  Date   HGBA1C 8.5 (H) 12/13/2021   HGBA1C 9.7 (H) 04/20/2018   HGBA1C 10.5 (H) 01/31/2014    Latest Reference Range & Units 08/21/22 07:41  Sodium 135 - 145 mmol/L 134 (L)  Potassium 3.5 - 5.1 mmol/L 4.0  Chloride 98 - 111 mmol/L 101  CO2 22 - 32 mmol/L 24  Glucose 70 - 99  mg/dL 540 (H)  BUN 6 - 20 mg/dL 9  Creatinine 9.81 - 1.91 mg/dL 4.78 (L)  Calcium 8.9 - 10.3 mg/dL 9.7  Anion gap 5 - 15  9  GFR, Estimated >60 mL/min >60  (L): Data is abnormally low (H): Data is abnormally high  ASSESSMENT / PLAN / RECOMMENDATIONS:   1) Type 1 Diabetes Mellitus, poorly controlled, Without complications - Most recent A1c of 11.0 %. Goal A1c < 7.0 %.     -Poorly controlled diabetes -In reviewing his CGM, the patient has been noted with a steep decline in her BG's overnight, but they tend to increase throughout the day, I initially suspected that she does not take any prandial insulin, patient assures me that she does take prandial insulin with meals but she also admits to grazing throughout the day -I have advised the patient to avoid snacking/grazing throughout the day as it will make it impossible to optimize her glucose -We did discuss low-carb options for snacking if needed -I will adjust her insulin as below -She will be provided with a correction scale -A referral has been placed to our CDE for training    MEDICATIONS: Continue Toujeo 20 units daily Change Humalog 14-16 units 3 times daily before every meal Start correction factor : Humalog (BG -130/35) 3 times daily before every meal  EDUCATION / INSTRUCTIONS: BG monitoring instructions: Patient is instructed to check her blood sugars 3 times a day, before meals. Call [facility] if: BG persistently < 70  I reviewed the Rule of 15 for the treatment of hypoglycemia in detail with the patient. Literature supplied.   2) Diabetic complications:  Eye: Does not have known diabetic retinopathy.  Neuro/ Feet: Does not  have known diabetic peripheral neuropathy. Renal: Patient does not have known baseline CKD. She is not on an ACEI/ARB at present.  3) Hyperthyroidism  -Clinically she has been improving -No local neck symptoms -We briefly discussed alternative options to include RAI versus total thyroidectomy versus continuation with methimazole -Patient has tolerated home and does not want to stay long period of time away from the baby, we also discussed the rare risk of Graves' orbitopathy following RAI treatment  -TFTs today are normal, will decrease methimazole as below  Medication  Decrease methimazole 10 mg, 1.5 tablets daily  4)Graves' Disease:  -No extrathyroidal manifestation of Graves' disease   Follow-up in 3 months  Signed electronically by: Lyndle Herrlich, MD  [facility] Endocrinology  [facility] [address] [place] [place], [place] [address] Phone: [phone] FAX: [phone]   CC: Betsey Amen, MD [address] [place] [address] Phone: [phone]  Fax: [phone]    Return to Endocrinology clinic as below: No future appointments.

## 2022-12-01 NOTE — Patient Instructions (Addendum)
Continue Toujeo 28 units daily Humalog 16 units with each meal Humalog correctional insulin: ADD extra units on insulin to your meal-time Humalog dose if your blood sugars are higher than 165. Use the scale below to help guide you:   Blood sugar before meal Number of units to inject  Less than 165 0 unit  166 -  200 1 units  201 -  235 2 units  236 -  270 3 units  271 -  305 4 units  306 -  340 5 units  341 -  375 6 units  376 -  410 7 units  411 -  445 8 units      HOW TO TREAT LOW BLOOD SUGARS (Blood sugar LESS THAN 70 MG/DL) Please follow the RULE OF 15 for the treatment of hypoglycemia treatment (when your (blood sugars are less than 70 mg/dL)   STEP 1: Take 15 grams of carbohydrates when your blood sugar is low, which includes:  3-4 GLUCOSE TABS  OR 3-4 OZ OF JUICE OR REGULAR SODA OR ONE TUBE OF GLUCOSE GEL    STEP 2: RECHECK blood sugar in 15 MINUTES STEP 3: If your blood sugar is still low at the 15 minute recheck --> then, go back to STEP 1 and treat AGAIN with another 15 grams of carbohydrates.

## 2023-03-30 ENCOUNTER — Ambulatory Visit: Payer: 59 | Admitting: Internal Medicine

## 2023-03-30 NOTE — Progress Notes (Deleted)
Name: Angela Meyer  MRN/ DOB: [id number], 04-26-1996   Age/ Sex: 27 y.o., female    PCP: Betsey Amen, MD   Reason for Endocrinology Evaluation: Type 1 Diabetes Mellitus/Hyperthyroidism     Date of Initial Endocrinology Visit: 12/01/2022    PATIENT IDENTIFIER: Angela Meyer is a 27 y.o. female with a past medical history of DM. The patient presented for initial endocrinology clinic visit on 12/01/2022 for consultative assistance with her diabetes management.    HPI: Angela Meyer was    Diagnosed with DM at age 59 Prior Medications tried/Intolerance: She was on a pump early in her diagnosis until age 65  Currently checking blood sugars multiple x / day Hypoglycemia episodes : no                Hemoglobin A1c has ranged from 5.2% in 2012, peaking at 11.0% in 2024.   In terms of diet, the patient eats 3 meals a day, grazed, avoids sugar sweetened beverages   She denies nausea, vomiting Denies constipation diarrhea Denies tingling of the feet   THYROID HISTORY: Patient has been noted with hyperthyroid since 2008, thyroid uptake and scan 04/2007 showed homogenous intense uptake of the thyroid at 96% . Her most recent TSH was suppressed 0.01 u IU/mL on 04/11/2022, with elevated free T4 >5.5 NG/DL.   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 Shawnee Knapp  She already has a Dexcom and OmniPod at home , would like to get training on it     HOME DIABETES REGIMEN: Toujeo 28 units daily  Humalog 12 - 15 units TIDQAC Methimazole 10 mg BID    Statin: no ACE-I/ARB: no    CONTINUOUS GLUCOSE MONITORING RECORD INTERPRETATION    Dates of Recording: 5/20 - 11/23/2022  Sensor description: [employer]  Results statistics:   CGM use % of time  69  Average and SD 288/30  Time in range    15    %  % Time Above 180 16  % Time above 250 69  % Time Below target 0   Glycemic patterns summary: BG's trend down overnight and increase during the day  Hyperglycemic episodes throughout the day  Hypoglycemic episodes occurred n/a  Overnight periods: Trendsdown    DIABETIC COMPLICATIONS: Microvascular complications:   Denies: CKD Last eye exam: Completed   Macrovascular complications:   Denies: CAD, PVD, CVA   PAST HISTORY: Past Medical History:  Past Medical History:  Diagnosis Date   Chronic autoimmune thyroiditis    Diabetes mellitus    Dyspepsia    Fetal tachycardia before the onset of labor    Goiter with hyperthyroidism    Hyperthyroidism    Hypothyroidism    Irritability    Irritability    Tachycardia    Thyroiditis, autoimmune    Thyrotoxicosis with diffuse goiter    Tremor    Tremor    Weight loss    Weight loss, abnormal    Past Surgical History:  Past Surgical History:  Procedure Laterality Date   CESAREAN SECTION N/A 12/16/2021   Procedure: CESAREAN SECTION;  Surgeon: Clance Boll A, DO;  Location: [facility];  Service: Obstetrics;  Laterality: N/A;   NO  PAST SURGERIES      Social History:  reports that she has never smoked. She has never used smokeless tobacco. She reports that she does not currently use alcohol. She reports that she does not use drugs. Family History:  Family History  Problem Relation Age of Onset   Diabetes Father    Congenital heart disease Father    Diabetes Paternal Aunt    Thyroid disease Maternal Grandmother    Heart disease Maternal Grandfather      HOME MEDICATIONS: Allergies as of 03/30/2023   No Known Allergies      Medication List        Accurate as of March 30, 2023 11:35 AM. If you have any questions, ask your nurse or doctor.          Dexcom G6 Sensor Misc 1 Device by Does not apply route as directed.   Dexcom G6 Transmitter Misc 1 Device  by Does not apply route as directed.   ibuprofen 600 MG tablet Commonly known as: ADVIL Take 1 tablet (600 mg total) by mouth every 6 (six) hours.   insulin lispro 100 UNIT/ML injection Commonly known as: HUMALOG Inject 0.04-0.12 mLs (4-12 Units total) into the skin 3 (three) times daily before meals.   Insulin Pen Needle 32G X 4 MM Misc 1 Device by Does not apply route in the morning, at noon, in the evening, and at bedtime.   labetalol 300 MG tablet Commonly known as: NORMODYNE Take 1 tablet (300 mg total) by mouth 3 (three) times daily.   methimazole 10 MG tablet Commonly known as: TAPAZOLE Take 1.5 tablets (15 mg total) by mouth daily.   NIFEdipine 30 MG 24 hr tablet Commonly known as: ADALAT CC Take 1 tablet (30 mg total) by mouth at bedtime.   NIFEdipine 60 MG 24 hr tablet Commonly known as: ADALAT CC Take 1 tablet (60 mg total) by mouth daily.   Omnipod 5 G6 Intro (Gen 5) Kit 1 Device by Does not apply route every other day.   Omnipod 5 G6 Pods (Gen 5) Misc 1 Device by Does not apply route every other day.   oxyCODONE 5 MG immediate release tablet Commonly known as: Oxy IR/ROXICODONE Take 1-2 tablets (5-10 mg total) by mouth every 4 (four) hours as needed for moderate pain.   prenatal vitamin w/FE, FA 27-1 MG Tabs tablet Take 1 tablet by mouth daily at 12 noon.   Toujeo Max SoloStar 300 UNIT/ML Solostar Pen Generic drug: insulin glargine (2 Unit Dial) Inject 28 Units into the skin at bedtime.         ALLERGIES: No Known Allergies   REVIEW OF SYSTEMS: A comprehensive ROS was conducted with the patient and is negative except as per HPI     OBJECTIVE:   VITAL SIGNS: There were no vitals taken for this visit.   PHYSICAL EXAM:  General: Pt appears well and is in NAD  Neck: General: Supple without adenopathy or carotid bruits. Thyroid: Thyroid size normal.  No goiter or nodules appreciated.   Lungs: Clear with good BS bilat   Heart: RRR   Abdomen:   soft, nontender  Extremities:  Lower extremities - No pretibial edema.   Neuro: MS is good with appropriate affect, pt is alert and Ox3    DM foot exam: 12/01/2022  The skin of the feet is intact without sores or ulcerations. The pedal pulses are 2+ on right and 2+ on left. The sensation is intact to a screening  5.07, 10 gram monofilament bilaterally    DATA REVIEWED:  Lab Results  Component Value Date   HGBA1C 11.0 (A) 12/01/2022   HGBA1C 8.5 (H) 12/13/2021   HGBA1C 9.7 (H) 04/20/2018    Latest Reference Range & Units 08/21/22 07:41  Sodium 135 - 145 mmol/L 134 (L)  Potassium 3.5 - 5.1 mmol/L 4.0  Chloride 98 - 111 mmol/L 101  CO2 22 - 32 mmol/L 24  Glucose 70 - 99 mg/dL 161 (H)  BUN 6 - 20 mg/dL 9  Creatinine 0.96 - 0.45 mg/dL 4.09 (L)  Calcium 8.9 - 10.3 mg/dL 9.7  Anion gap 5 - 15  9  GFR, Estimated >60 mL/min >60  (L): Data is abnormally low (H): Data is abnormally high  ASSESSMENT / PLAN / RECOMMENDATIONS:   1) Type 1 Diabetes Mellitus, poorly controlled, Without complications - Most recent A1c of 11.0 %. Goal A1c < 7.0 %.     -Poorly controlled diabetes -In reviewing his CGM, the patient has been noted with a steep decline in her BG's overnight, but they tend to increase throughout the day, I initially suspected that she does not take any prandial insulin, patient assures me that she does take prandial insulin with meals but she also admits to grazing throughout the day -I have advised the patient to avoid snacking/grazing throughout the day as it will make it impossible to optimize her glucose -We did discuss low-carb options for snacking if needed -I will adjust her insulin as below -She will be provided with a correction scale -A referral has been placed to our CDE for training    MEDICATIONS: Continue Toujeo 20 units daily Change Humalog 14-16 units 3 times daily before every meal Start correction factor : Humalog (BG -130/35) 3 times daily before  every meal  EDUCATION / INSTRUCTIONS: BG monitoring instructions: Patient is instructed to check her blood sugars 3 times a day, before meals. Call [place] [facility] if: BG persistently < 70  I reviewed the Rule of 15 for the treatment of hypoglycemia in detail with the patient. Literature supplied.   2) Diabetic complications:  Eye: Does not have known diabetic retinopathy.  Neuro/ Feet: Does not have known diabetic peripheral neuropathy. Renal: Patient does not have known baseline CKD. She is not on an ACEI/ARB at present.  3) Hyperthyroidism  -Clinically she has been improving -No local neck symptoms -We briefly discussed alternative options to include RAI versus total thyroidectomy versus continuation with methimazole -Patient has tolerated home and does not want to stay long period of time away from the baby, we also discussed the rare risk of Graves' orbitopathy following RAI treatment  -TFTs today are normal, will decrease methimazole as below  Medication  Decrease methimazole 10 mg, 1.5 tablets daily  4)Graves' Disease:  -No extrathyroidal manifestation of Graves' disease   Follow-up in 3 months  Signed electronically by: Lyndle Herrlich, MD  [facility] Endocrinology  [facility] [address] [place] [place], [place] [address] Phone: [phone] FAX: [phone]   CC: Betsey Amen, MD [address] [place] [address] Phone: [phone]  Fax: [phone]    Return to Endocrinology clinic as below: Future Appointments  Date Time Provider Department Center  03/30/2023  2:40 PM Hallee Mckenny, Konrad Dolores, MD [id number] None

## 2023-06-15 ENCOUNTER — Ambulatory Visit: Payer: 59 | Admitting: Internal Medicine

## 2023-06-15 ENCOUNTER — Encounter: Payer: Self-pay | Admitting: Internal Medicine

## 2023-06-15 VITALS — BP 108/68 | HR 75 | Ht 62.0 in | Wt 133.4 lb

## 2023-06-15 DIAGNOSIS — E1065 Type 1 diabetes mellitus with hyperglycemia: Secondary | ICD-10-CM | POA: Diagnosis not present

## 2023-06-15 DIAGNOSIS — E0590 Thyrotoxicosis, unspecified without thyrotoxic crisis or storm: Secondary | ICD-10-CM | POA: Diagnosis not present

## 2023-06-15 DIAGNOSIS — E0500 Thyrotoxicosis with diffuse goiter without thyrotoxic crisis or storm: Secondary | ICD-10-CM

## 2023-06-15 LAB — POCT GLYCOSYLATED HEMOGLOBIN (HGB A1C): Hemoglobin A1C: 10.8 % — AB (ref 4.0–5.6)

## 2023-06-15 LAB — POCT CBG (FASTING - GLUCOSE)-MANUAL ENTRY: Glucose Fasting, POC: 224 mg/dL — AB (ref 70–99)

## 2023-06-15 MED ORDER — DEXCOM G7 SENSOR MISC: 1.0000 | 3 refills | Status: DC

## 2023-06-15 MED ORDER — TOUJEO MAX SOLOSTAR 300 UNIT/ML ~~LOC~~ SOPN: 34.0000 [IU] | PEN_INJECTOR | Freq: Every day | SUBCUTANEOUS | 3 refills | Status: DC

## 2023-06-15 MED ORDER — OMNIPOD 5 G7 PODS (GEN 5) MISC: 1.0000 | 3 refills | Status: DC

## 2023-06-15 MED ORDER — OMNIPOD 5 G7 INTRO (GEN 5) KIT: 1.0000 | PACK | 0 refills | Status: DC

## 2023-06-15 NOTE — Progress Notes (Unsigned)
Name: Angela Meyer  MRN/ DOB: [id number], 01-13-1996   Age/ Sex: 27 y.o., female    PCP: Betsey Amen, MD   Reason for Endocrinology Evaluation: Type 1 Diabetes Mellitus/Hyperthyroidism     Date of Initial Endocrinology Visit: 12/01/2022    PATIENT IDENTIFIER: Angela Meyer is a 27 y.o. female with a past medical history of DM. The patient presented for initial endocrinology clinic visit on 12/01/2022 for consultative assistance with her diabetes management.    HPI: Angela Meyer was    Diagnosed with DM at age 34 Prior Medications tried/Intolerance: She was on a pump early in her diagnosis until age 50                Hemoglobin A1c has ranged from 5.2% in 2012, peaking at 11.0% in 2024.   She already has a Dexcom and OmniPod at home , would like to get training on it  On her initial visit she had an A1c of 11.0%, she was on multiple daily injections of insulin, she already had OmniPods at home and was getting ready to start, we provided her with a correction scale   THYROID HISTORY: Patient has been noted with hyperthyroid since 2008, thyroid uptake and scan 04/2007 showed homogenous intense uptake of the thyroid at 96% . Her most recent TSH was suppressed 0.01 u IU/mL on 04/11/2022, with elevated free T4 >5.5 NG/DL.   She has been on methimazole intermittently , restarted 06/2022   She is a Child psychotherapist  She is S/P delivery 2023    Transferred care from Dr. Shawnee Knapp   SUBJECTIVE:   During the last visit (12/01/2022): A1c 11.0%  Today (06/15/23): Angela Meyer is here for a follow-up on diabetes and thyroid management.  She checks her blood sugars occasionally . The patient has not had hypoglycemic episodes since the last clinic visit.    She denies nausea, vomiting Denies constipation diarrhea Denies local neck swelling  Denies tremors , palpitations     HOME DIABETES REGIMEN: Toujeo 20 units daily - takes 30 units daily  Humalog 14-16 units TIDQAC CF:  Humalog (BG -130/35) Methimazole 10 mg, 1.5 tab daily- takes 1 tab    Statin: no ACE-I/ARB: no    CONTINUOUS GLUCOSE MONITORING RECORD INTERPRETATION: N/A    DIABETIC COMPLICATIONS: Microvascular complications:   Denies: CKD Last eye exam: Completed   Macrovascular complications:   Denies: CAD, PVD, CVA   PAST HISTORY: Past Medical History:  Past Medical History:  Diagnosis Date   Chronic autoimmune thyroiditis    Diabetes mellitus    Dyspepsia    Fetal tachycardia before the onset of labor    Goiter with hyperthyroidism    Hyperthyroidism    Hypothyroidism    Irritability    Irritability    Tachycardia    Thyroiditis, autoimmune    Thyrotoxicosis with diffuse goiter    Tremor    Tremor    Weight loss    Weight loss, abnormal    Past Surgical History:  Past Surgical History:  Procedure Laterality Date   CESAREAN SECTION N/A 12/16/2021   Procedure: CESAREAN SECTION;  Surgeon: Clance Boll A, DO;  Location: [facility] ORS;  Service: Obstetrics;  Laterality: N/A;   NO PAST SURGERIES      Social History:  reports that she has never smoked. She has never used smokeless tobacco. She reports that she does not currently use alcohol. She reports that she does not use drugs. Family History:  Family  History  Problem Relation Age of Onset   Diabetes Father    Congenital heart disease Father    Diabetes Paternal Aunt    Thyroid disease Maternal Grandmother    Heart disease Maternal Grandfather      HOME MEDICATIONS: Allergies as of 06/15/2023   No Known Allergies      Medication List        Accurate as of June 15, 2023 10:15 AM. If you have any questions, ask your nurse or doctor.          Dexcom G6 Sensor Misc 1 Device by Does not apply route as directed.   Dexcom G6 Transmitter Misc 1 Device by Does not apply route as directed.   ibuprofen 600 MG tablet Commonly known as: ADVIL Take 1 tablet (600 mg total) by mouth every 6 (six) hours.    insulin lispro 100 UNIT/ML injection Commonly known as: HUMALOG Inject 0.04-0.12 mLs (4-12 Units total) into the skin 3 (three) times daily before meals.   Insulin Pen Needle 32G X 4 MM Misc 1 Device by Does not apply route in the morning, at noon, in the evening, and at bedtime.   labetalol 300 MG tablet Commonly known as: NORMODYNE Take 1 tablet (300 mg total) by mouth 3 (three) times daily.   methimazole 10 MG tablet Commonly known as: TAPAZOLE Take 1.5 tablets (15 mg total) by mouth daily.   NIFEdipine 30 MG 24 hr tablet Commonly known as: ADALAT CC Take 1 tablet (30 mg total) by mouth at bedtime.   NIFEdipine 60 MG 24 hr tablet Commonly known as: ADALAT CC Take 1 tablet (60 mg total) by mouth daily.   Omnipod 5 DexG7G6 Intro Gen 5 Kit 1 Device by Does not apply route every other day.   Omnipod 5 DexG7G6 Pods Gen 5 Misc 1 Device by Does not apply route every other day.   oxyCODONE 5 MG immediate release tablet Commonly known as: Oxy IR/ROXICODONE Take 1-2 tablets (5-10 mg total) by mouth every 4 (four) hours as needed for moderate pain.   prenatal vitamin w/FE, FA 27-1 MG Tabs tablet Take 1 tablet by mouth daily at 12 noon.   Toujeo Max SoloStar 300 UNIT/ML Solostar Pen Generic drug: insulin glargine (2 Unit Dial) Inject 28 Units into the skin at bedtime.         ALLERGIES: No Known Allergies   REVIEW OF SYSTEMS: A comprehensive ROS was conducted with the patient and is negative except as per HPI    OBJECTIVE:   VITAL SIGNS: BP 108/68   Pulse 75   Ht 5\' 2"  (1.575 m)   Wt 133 lb 6.4 oz (60.5 kg)   SpO2 96%   BMI 24.40 kg/m    PHYSICAL EXAM:  General: Pt appears well and is in NAD  Neck: General: Supple without adenopathy or carotid bruits. Thyroid: Thyroid size normal.  No goiter or nodules appreciated.   Lungs: Clear with good BS bilat   Heart: RRR   Extremities:  Lower extremities - No pretibial edema.   Neuro: MS is good with appropriate  affect, pt is alert and Ox3    DM foot exam: 12/01/2022  The skin of the feet is intact without sores or ulcerations. The pedal pulses are 2+ on right and 2+ on left. The sensation is intact to a screening 5.07, 10 gram monofilament bilaterally    DATA REVIEWED:  Lab Results  Component Value Date   HGBA1C 10.8 (A) 06/15/2023  HGBA1C 11.0 (A) 12/01/2022   HGBA1C 8.5 (H) 12/13/2021    Latest Reference Range & Units 06/15/23 10:41  Sodium 135 - 146 mmol/L 137  Potassium 3.5 - 5.3 mmol/L 3.8  Chloride 98 - 110 mmol/L 104  CO2 20 - 32 mmol/L 25  Glucose 65 - 99 mg/dL 161 (H)  BUN 7 - 25 mg/dL 12  Creatinine 0.96 - 0.45 mg/dL 4.09 (L)  Calcium 8.6 - 10.2 mg/dL 9.1  BUN/Creatinine Ratio 6 - 22 (calc) 29 (H)  Total CHOL/HDL Ratio <5.0 (calc) 3.3  Cholesterol <200 mg/dL 811  HDL Cholesterol > OR = 50 mg/dL 32 (L)  LDL Cholesterol (Calc) mg/dL (calc) 59  MICROALB/CREAT RATIO <30 mg/g creat 11  Non-HDL Cholesterol (Calc) <130 mg/dL (calc) 73  Triglycerides <150 mg/dL 61    Latest Reference Range & Units 06/15/23 10:41  TSH mIU/L 0.01 (L)  T4,Free(Direct) 0.8 - 1.8 ng/dL 1.9 (H)    Latest Reference Range & Units 06/15/23 10:41  Microalb, Ur mg/dL 1.9  MICROALB/CREAT RATIO <30 mg/g creat 11  Creatinine, Urine 20 - 275 mg/dL 914     In office BG 782 Mg/DL  ASSESSMENT / PLAN / RECOMMENDATIONS:   1) Type 1 Diabetes Mellitus, poorly controlled, Without complications - Most recent A1c of 10.8 %. Goal A1c < 7.0 %.    -Patient continues with poorly controlled diabetes -She has not been checking glucose at home, no glucose data today -She has not been using correction scale -Our CDE attempted to call her and left a voicemail, but the patient states she did not receive a call, will enter a new referral for OmniPod training -I will increase her basal insulin, as her fasting BG 224 Mg/DL -GFR, MA/CR ratio normal    MEDICATIONS: Increase Toujeo 34 units daily Continue Humalog  14 units 3 times daily before every meal Continue correction factor : Humalog (BG -130/35) 3 times daily before every meal  EDUCATION / INSTRUCTIONS: BG monitoring instructions: Patient is instructed to check her blood sugars 3 times a day, before meals. Call  Endocrinology clinic if: BG persistently < 70  I reviewed the Rule of 15 for the treatment of hypoglycemia in detail with the patient. Literature supplied.   2) Diabetic complications:  Eye: Does not have known diabetic retinopathy.  Neuro/ Feet: Does not have known diabetic peripheral neuropathy. Renal: Patient does not have known baseline CKD. She is not on an ACEI/ARB at present.  3) Hyperthyroidism  -Patient is clinically euthyroid -No local neck symptoms -She has opted to remain on methimazole -She is on less methimazole than previously prescribed, hence biochemical hyperthyroid.  Will increase methimazole as below   Medication  Increase methimazole 10 mg, 2 tablet daily    4)Graves' Disease:  -No extrathyroidal manifestation of Graves' disease   Follow-up in 4 months  Signed electronically by: Lyndle Herrlich, MD  [facility] End[facility]  [facility] [address] [place] [place], [place] [address] Phone: [phone] FAX: [phone]   CC: Betsey Amen, MD [address] [place] [address] Phone: [phone]  Fax: [phone]    Return to Endocrinology clinic as below: No future appointments.

## 2023-06-15 NOTE — Patient Instructions (Addendum)
Increase  Toujeo 34 units daily Humalog 14  units with each meal Humalog correctional insulin: ADD extra units on insulin to your meal-time Humalog dose if your blood sugars are higher than 165. Use the scale below to help guide you:   Blood sugar before meal Number of units to inject  Less than 165 0 unit  166 -  200 1 units  201 -  235 2 units  236 -  270 3 units  271 -  305 4 units  306 -  340 5 units  341 -  375 6 units  376 -  410 7 units  411 -  445 8 units      HOW TO TREAT LOW BLOOD SUGARS (Blood sugar LESS THAN 70 MG/DL) Please follow the RULE OF 15 for the treatment of hypoglycemia treatment (when your (blood sugars are less than 70 mg/dL)   STEP 1: Take 15 grams of carbohydrates when your blood sugar is low, which includes:  3-4 GLUCOSE TABS  OR 3-4 OZ OF JUICE OR REGULAR SODA OR ONE TUBE OF GLUCOSE GEL    STEP 2: RECHECK blood sugar in 15 MINUTES STEP 3: If your blood sugar is still low at the 15 minute recheck --> then, go back to STEP 1 and treat AGAIN with another 15 grams of carbohydrates.

## 2023-06-16 LAB — LIPID PANEL
Cholesterol: 105 mg/dL (ref <200)
HDL: 32 mg/dL — ABNORMAL LOW (ref >=50)
LDL Cholesterol (Calc): 59 mg/dL
Non-HDL Cholesterol (Calc): 73 mg/dL (ref <130)
Total CHOL/HDL Ratio: 3.3 (calc) (ref <5.0)
Triglycerides: 61 mg/dL (ref <150)

## 2023-06-16 LAB — MICROALBUMIN / CREATININE URINE RATIO
Creatinine, Urine: 170 mg/dL (ref 20–275)
Microalb Creat Ratio: 11 mg/g{creat} (ref <30)
Microalb, Ur: 1.9 mg/dL

## 2023-06-16 LAB — BASIC METABOLIC PANEL
BUN/Creatinine Ratio: 29 (calc) — ABNORMAL HIGH (ref 6–22)
BUN: 12 mg/dL (ref 7–25)
CO2: 25 mmol/L (ref 20–32)
Calcium: 9.1 mg/dL (ref 8.6–10.2)
Chloride: 104 mmol/L (ref 98–110)
Creat: 0.41 mg/dL — ABNORMAL LOW (ref 0.50–0.96)
Glucose, Bld: 230 mg/dL — ABNORMAL HIGH (ref 65–99)
Potassium: 3.8 mmol/L (ref 3.5–5.3)
Sodium: 137 mmol/L (ref 135–146)

## 2023-06-16 LAB — TSH: TSH: 0.01 m[IU]/L — ABNORMAL LOW

## 2023-06-16 LAB — T4, FREE: Free T4: 1.9 ng/dL — ABNORMAL HIGH (ref 0.8–1.8)

## 2023-06-18 MED ORDER — METHIMAZOLE 10 MG PO TABS: 20.0000 mg | ORAL_TABLET | Freq: Every day | ORAL | 2 refills | Status: DC

## 2023-06-30 ENCOUNTER — Telehealth: Payer: Self-pay | Admitting: Nutrition

## 2023-06-30 NOTE — Telephone Encounter (Signed)
 Lvm to call to schedule pump training

## 2023-07-07 ENCOUNTER — Other Ambulatory Visit: Payer: Self-pay | Admitting: Nutrition

## 2023-07-08 ENCOUNTER — Encounter: Payer: Self-pay | Admitting: Internal Medicine

## 2023-07-08 DIAGNOSIS — E0590 Thyrotoxicosis, unspecified without thyrotoxic crisis or storm: Secondary | ICD-10-CM

## 2023-07-14 ENCOUNTER — Other Ambulatory Visit: Payer: 59

## 2023-07-14 ENCOUNTER — Encounter: Payer: 59 | Attending: Internal Medicine | Admitting: Nutrition

## 2023-07-14 ENCOUNTER — Telehealth: Payer: Self-pay

## 2023-07-14 ENCOUNTER — Other Ambulatory Visit (HOSPITAL_COMMUNITY): Payer: Self-pay

## 2023-07-14 DIAGNOSIS — E1065 Type 1 diabetes mellitus with hyperglycemia: Secondary | ICD-10-CM | POA: Insufficient documentation

## 2023-07-14 NOTE — Progress Notes (Signed)
Patient was trained on how to use the Dexcom G6 sensors.  This was linked to her phone as well as to [facility].  She inserted the sensor into  her right upper outer arm and linked this to her phone and to the OmniPod 5 controller.  She was trained also on the OmniPod 5 insulin pump.  Settings were put into the PDM by the patient per Dr. Harvel Ricks orders:  Basal rate: 1.0u/hr, max basal rate: 2.0u/hr, target 110 with correction over 120, I/C: 1 with directions to take 10u ac meals, ISF: 35, duration action of insulin: 4 hrs., and max bolus of 20.  She filled a pod with Humalog insulin, and wanted to attach this to her abdomen.  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  She was directed to insert her pod 2 inches away from these areas, which she did without difficulty.  The pump was started, but the warmup time is 2 hours for the sensor to link to the PDM.  The PDM was then linked to [facility].  She was shown how to give a bolus and we discussed the need to:  bolus for all meals and snacks, put blood sugar readings into every bolus, do correction doses on all blood sugars over 250, and change pod when empty.  She reverbalized these 4 needs and had no final questions.

## 2023-07-14 NOTE — Patient Instructions (Signed)
Change pod every 3 days or when empty Bolus for all meals and snacks-10u for meals Do correction boluses for all blood sugars over 250. Call Omni Pod help line if problems or questions Change sensor every 10 days Change transmitter every 3 months Call Dexcom help line if sensor or communication problems

## 2023-07-14 NOTE — Telephone Encounter (Signed)
Pharmacy Patient Advocate Encounter   Received notification from CoverMyMeds that prior authorization for Dexcom G7 sensor is required/requested.   Insurance verification completed.   The patient is insured through [facility] .   Per test claim: PA required; PA submitted to above mentioned insurance via CoverMyMeds Key/confirmation #/EOC [id number] Status is pending

## 2023-07-15 ENCOUNTER — Encounter: Payer: Self-pay | Admitting: Internal Medicine

## 2023-07-15 LAB — T4, FREE: Free T4: 0.8 ng/dL (ref 0.8–1.8)

## 2023-07-15 LAB — TSH: TSH: 0.01 m[IU]/L — ABNORMAL LOW

## 2023-07-21 ENCOUNTER — Telehealth: Payer: Self-pay | Admitting: Nutrition

## 2023-07-21 NOTE — Telephone Encounter (Signed)
LVM to call me to let me know how she is doing on her insulin pump. ?

## 2023-07-24 ENCOUNTER — Ambulatory Visit: Payer: 59 | Admitting: Internal Medicine

## 2023-08-04 ENCOUNTER — Encounter: Payer: Self-pay | Admitting: Internal Medicine

## 2023-08-05 ENCOUNTER — Other Ambulatory Visit: Payer: Self-pay

## 2023-08-05 MED ORDER — OMNIPOD 5 DEXG7G6 INTRO GEN 5 KIT: PACK | 0 refills | Status: DC

## 2023-08-05 MED ORDER — OMNIPOD 5 DEXG7G6 PODS GEN 5 MISC: 3 refills | Status: DC

## 2023-08-06 ENCOUNTER — Other Ambulatory Visit: Payer: Self-pay

## 2023-08-06 MED ORDER — DEXCOM G7 SENSOR MISC: 1.0000 | 3 refills | Status: AC

## 2023-08-06 MED ORDER — DEXCOM G6 SENSOR MISC: 3 refills | Status: DC

## 2023-08-24 IMAGING — US US OB COMP LESS 14 WK
1 series · 15 of 26 positions shown · non-contrast
Comparison: None.

CLINICAL DATA: Pregnant, bleeding

EXAM:
OBSTETRIC <14 WK ULTRASOUND
TECHNIQUE: Transabdominal ultrasound was performed for evaluation of the
gestation as well as the maternal uterus and adnexal regions.

[Series 1: us ob comp less 14 wk · 15 of 26 slices shown]
[im 1/26]
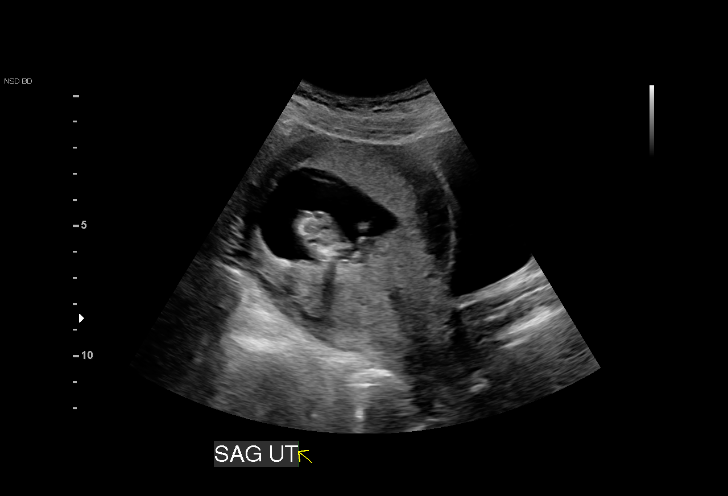
[im 3/26]
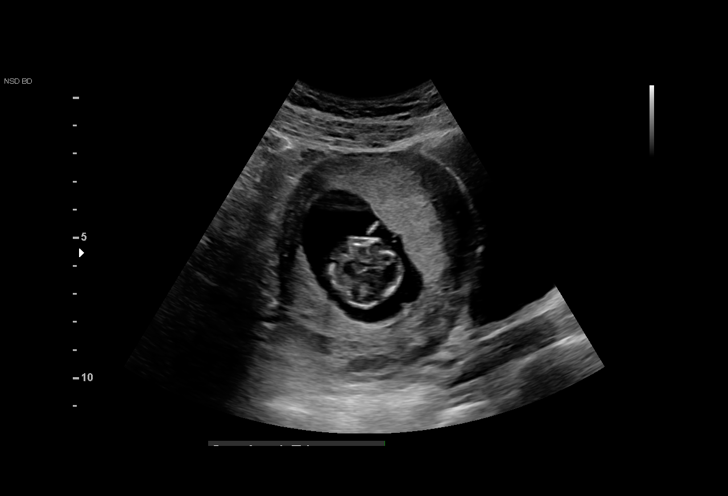
[im 5/26]
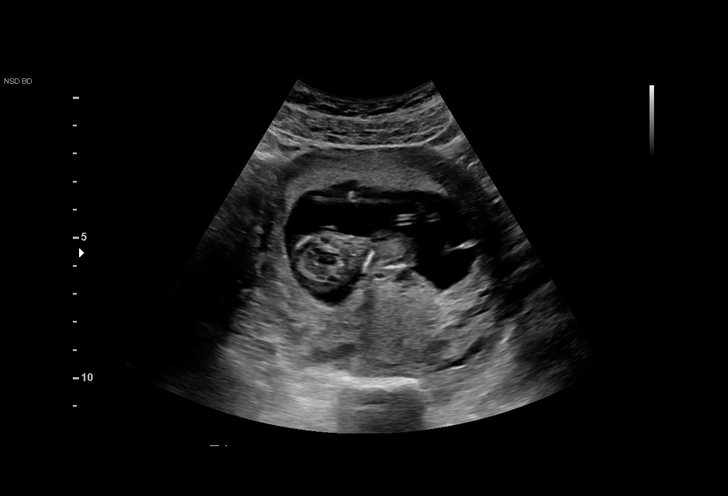
[im 7/26]
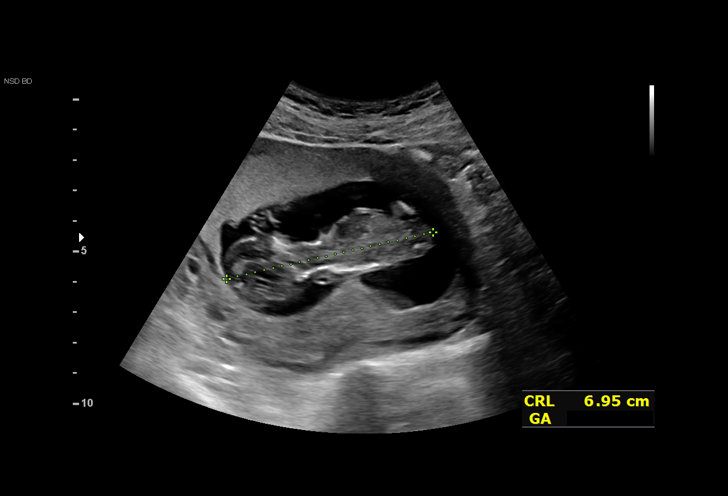
[im 8/26]
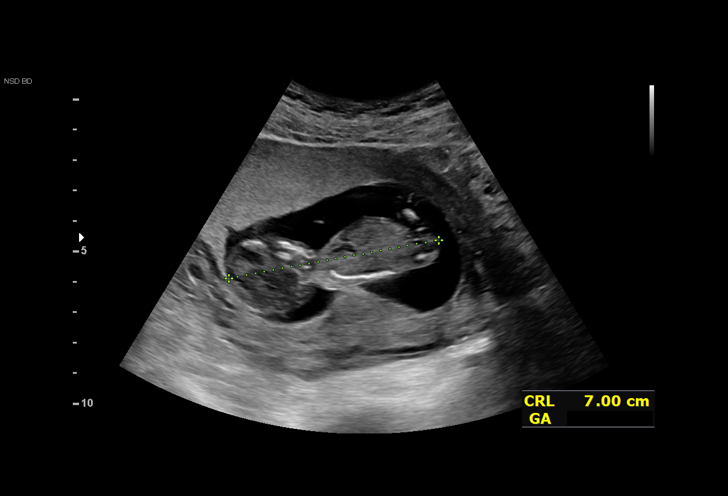
[im 10/26]
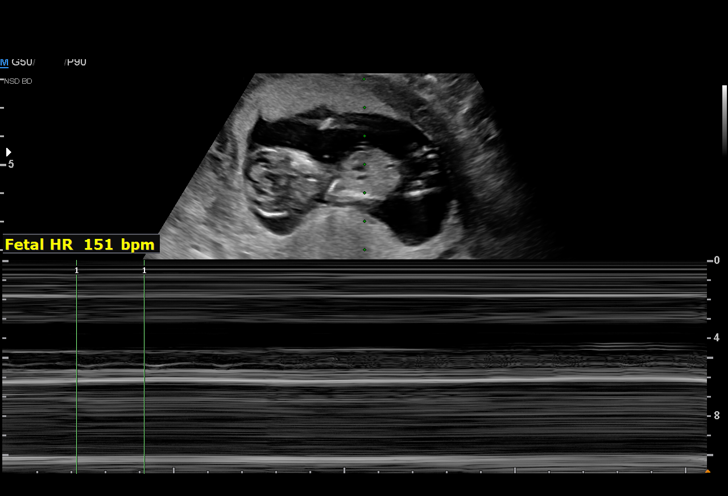
[im 12/26]
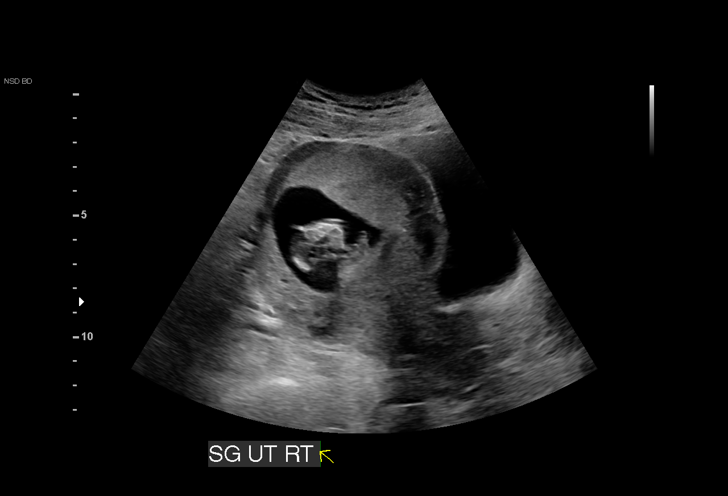
[im 14/26]
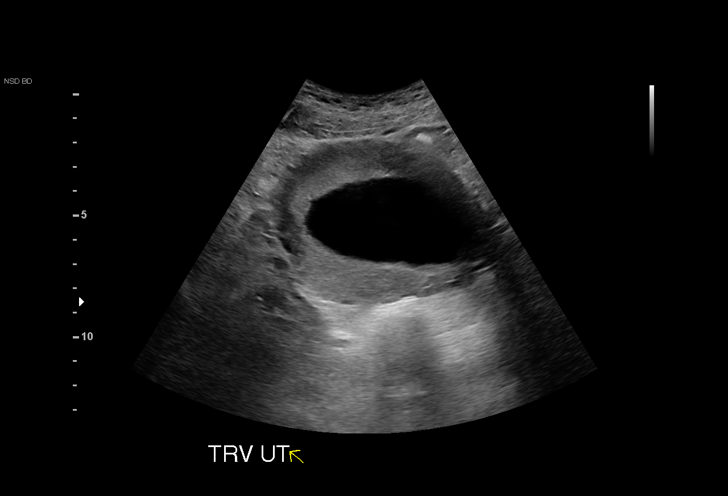
[im 15/26]
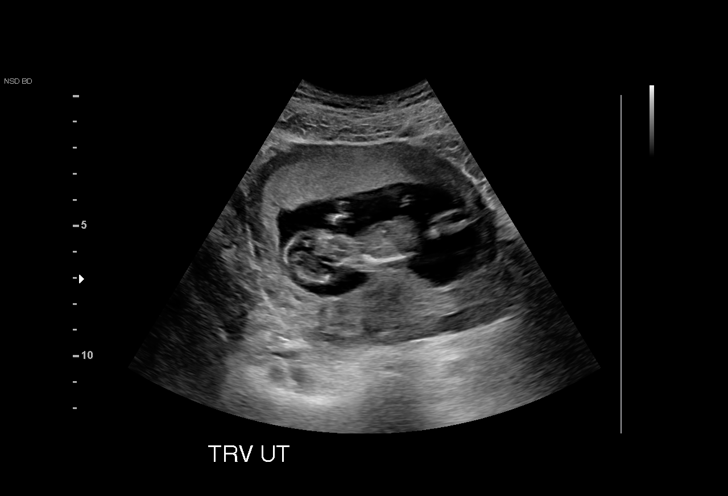
[im 17/26]
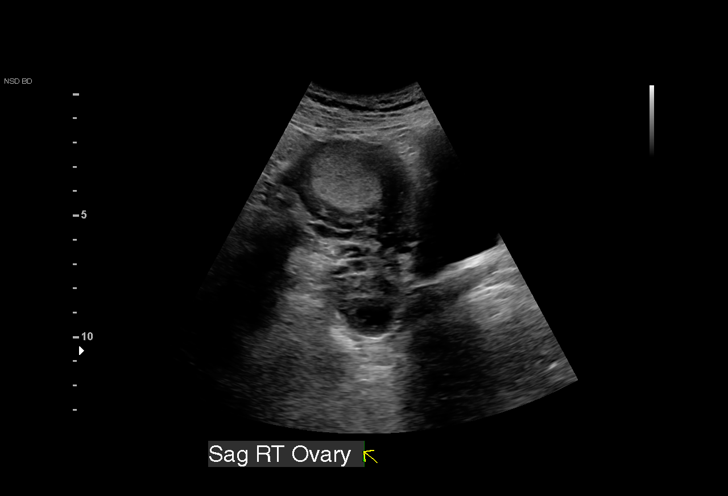
[im 19/26]
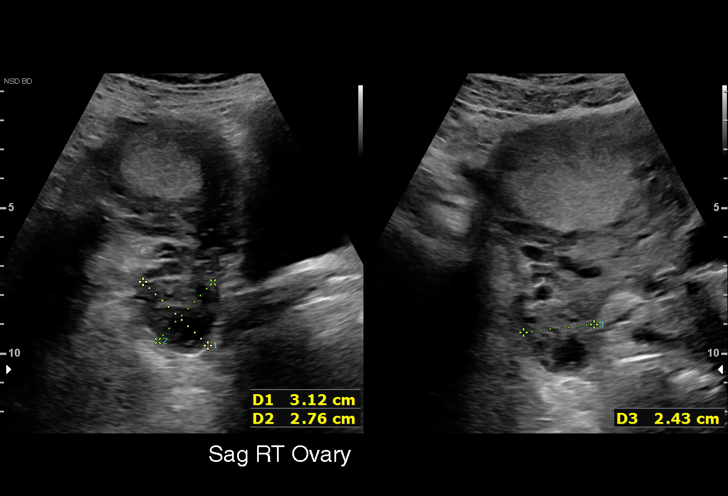
[im 20/26]
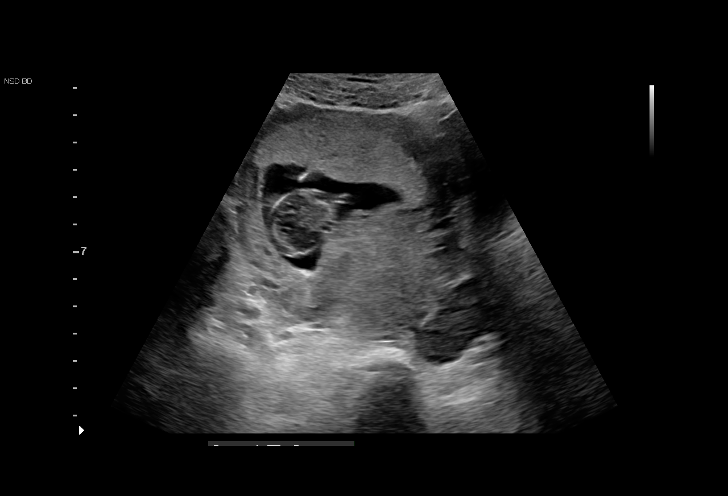
[im 22/26]
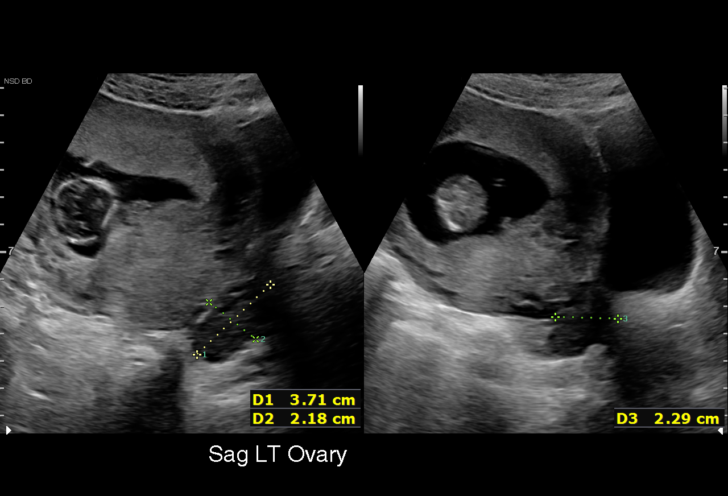
[im 24/26]
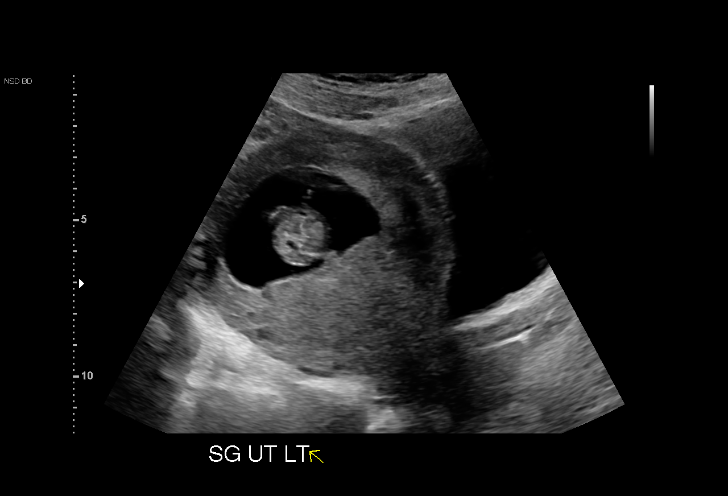
[im 26/26]
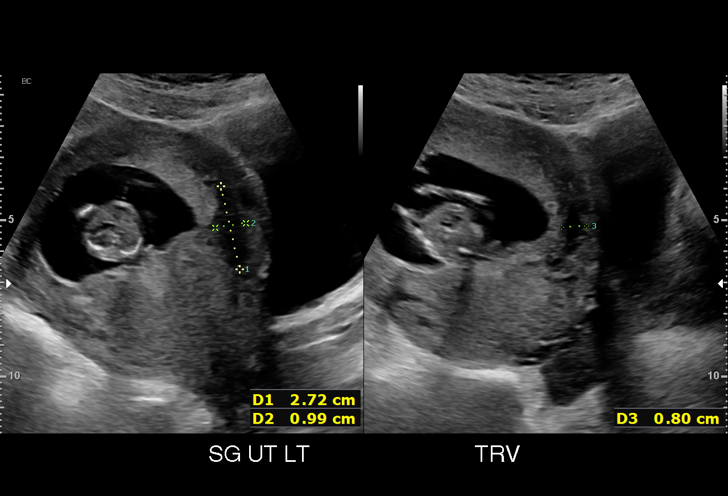

[15 of 26 positions shown; findings below may reference images not displayed]

FINDINGS: Intrauterine gestational sac: Single

Yolk sac:  Not Visualized.

Embryo:  Visualized.

Cardiac Activity: Visualized.

Heart Rate: 151 bpm

CRL:   69.5 mm   13 w 1 d                  US EDC: 01/26/2022

Subchorionic hemorrhage:  Small subchronic hemorrhage.

Maternal uterus/adnexae: Bilateral ovaries are within normal limits.

No free fluid.
IMPRESSION: Single intrauterine gestation with cardiac activity, measuring 13
weeks 1 day by crown-rump length, as above.

## 2023-08-25 NOTE — Telephone Encounter (Signed)
 Pharmacy Patient Advocate Encounter  Received notification from [facility] that Prior Authorization for Dexcom G7 sensor has been APPROVED through 07/13/24   PA #/Case ID/Reference #: [id number]

## 2023-09-22 ENCOUNTER — Encounter: Payer: Self-pay | Admitting: Internal Medicine

## 2023-09-23 MED ORDER — HUMALOG KWIKPEN 200 UNIT/ML ~~LOC~~ SOPN: 14.0000 [IU] | PEN_INJECTOR | Freq: Three times a day (TID) | SUBCUTANEOUS | 2 refills | Status: AC

## 2023-10-05 ENCOUNTER — Telehealth: Payer: Self-pay | Admitting: Nutrition

## 2023-10-05 NOTE — Telephone Encounter (Signed)
 Patient called to see why her pump was not streaming to [employer].  She reports having stopped the pump because she wanted to go on Dexcom G7 and pods were not compatable.  She has all of the supplies and I encouraged her to come in to help her change her PDM to be able to use the G7 sensors and make sure she is linked to [employer].  She can not come this week.  She is scheduled for next Monday.

## 2023-10-12 ENCOUNTER — Ambulatory Visit: Admitting: Nutrition

## 2023-10-12 ENCOUNTER — Telehealth: Payer: Self-pay | Admitting: Nutrition

## 2023-10-12 NOTE — Telephone Encounter (Signed)
 LVM that she missed her appointment with me this AM.  Told to call me to let me know if she started this herself to check the settings on her PDM and see if pods are compatible with her PDM.

## 2023-10-20 ENCOUNTER — Ambulatory Visit: Payer: 59 | Admitting: Internal Medicine

## 2023-10-20 NOTE — Progress Notes (Deleted)
 Name: Angela Meyer  MRN/ DOB: [id number], 1996-03-28   Age/ Sex: 28 y.o., female    PCP: Renate Caroline, MD   Reason for Endocrinology Evaluation: Type 1 Diabetes Mellitus/Hyperthyroidism     Date of Initial Endocrinology Visit: 12/01/2022    PATIENT IDENTIFIER: Angela Meyer is a 28 y.o. female with a past medical history of DM. The patient presented for initial endocrinology clinic visit on 12/01/2022 for consultative assistance with her diabetes management.    HPI: Angela Meyer was    Diagnosed with DM at age 68 Prior Medications tried/Intolerance: She was on a pump early in her diagnosis until age 67                Hemoglobin A1c has ranged from 5.2% in 2012, peaking at 11.0% in 2024.   She already has a Dexcom and OmniPod at home , would like to get training on it  On her initial visit she had an A1c of 11.0%, she was on multiple daily injections of insulin , she already had OmniPods at home and was getting ready to start, we provided her with a correction scale   She was trained on the OmniPod use 07/14/2023  THYROID  HISTORY: Patient has been noted with hyperthyroid since 2008, thyroid  uptake and scan 04/2007 showed homogenous intense uptake of the thyroid  at 96% . Her most recent TSH was suppressed 0.01 u IU/mL on 04/11/2022, with elevated free T4 >5.5 NG/DL.   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 Marcella Serge   SUBJECTIVE:   During the last visit (06/15/2023): A1c 10.8%  Today (10/20/23): Angela A Kniss is here for a follow-up on diabetes and thyroid  management.  She checks her blood sugars occasionally . The patient has not had hypoglycemic episodes since the last clinic visit.    She denies nausea, vomiting Denies constipation diarrhea Denies local neck swelling  Denies tremors , palpitations   This patient with type 1 diabetes is treated with OmniPod  (insulin  pump). During the visit the pump basal and bolus doses were reviewed including carb/insulin  rations and supplemental doses. The clinical list was updated. The glucose meter download was reviewed in detail to determine if the current pump settings are providing the best glycemic control without excessive hypoglycemia.  Pump and meter download:    Pump   OmniPod Settings   Insulin  type   Humalog    Basal rate       0000 0.8 u/h               I:C ratio       0000 1:8                   Sensitivity       0000  35       Goal       0000  110             Type & Model of Pump: *** Insulin  Type: Currently using ***.  There is no height or weight on file to calculate BMI.  PUMP STATISTICS: Average BG: *** +/- *** Average Daily Carbs (g): *** +/- *** Average Total Daily Insulin : *** +/- *** Average Daily Basal: *** (*** %) Average Daily Bolus: *** (*** %)     HOME DIABETES REGIMEN: Toujeo  34 units daily  Humalog  14 units TIDQAC CF: Humalog  (BG -  130/35) Methimazole  10 mg, 2 tabs daily    Statin: no ACE-I/ARB: no    CONTINUOUS GLUCOSE MONITORING RECORD INTERPRETATION: N/A    DIABETIC COMPLICATIONS: Microvascular complications:   Denies: CKD Last eye exam: Completed   Macrovascular complications:   Denies: CAD, PVD, CVA   PAST HISTORY: Past Medical History:  Past Medical History:  Diagnosis Date   Chronic autoimmune thyroiditis    Diabetes mellitus    Dyspepsia    Fetal tachycardia before the onset of labor    Goiter with hyperthyroidism    Hyperthyroidism    Hypothyroidism    Irritability    Irritability    Tachycardia    Thyroiditis, autoimmune    Thyrotoxicosis with diffuse goiter    Tremor    Tremor    Weight loss    Weight loss, abnormal    Past Surgical History:  Past Surgical History:  Procedure Laterality Date   CESAREAN SECTION N/A 12/16/2021   Procedure: CESAREAN SECTION;  Surgeon: Olin Bertin, DO;  Location:  [facility];  Service: Obstetrics;  Laterality: N/A;   NO PAST SURGERIES      Social History:  reports that she has never smoked. She has never used smokeless tobacco. She reports that she does not currently use alcohol. She reports that she does not use drugs. Family History:  Family History  Problem Relation Age of Onset   Diabetes Father    Congenital heart disease Father    Diabetes Paternal Aunt    Thyroid  disease Maternal Grandmother    Heart disease Maternal Grandfather      HOME MEDICATIONS: Allergies as of 10/20/2023   No Known Allergies      Medication List        Accurate as of October 20, 2023  7:05 AM. If you have any questions, ask your nurse or doctor.          STOP taking these medications    ibuprofen  600 MG tablet Commonly known as: ADVIL    labetalol  300 MG tablet Commonly known as: NORMODYNE    NIFEdipine  30 MG 24 hr tablet Commonly known as: ADALAT  CC   NIFEdipine  60 MG 24 hr tablet Commonly known as: ADALAT  CC   oxyCODONE  5 MG immediate release tablet Commonly known as: Oxy IR/ROXICODONE    prenatal vitamin w/FE, FA 27-1 MG Tabs tablet       TAKE these medications    Dexcom G7 Sensor Misc 1 Device by Does not apply route as directed.   Dexcom G6 Sensor Misc Change sensors every 10 days   HumaLOG  KwikPen 200 UNIT/ML KwikPen Generic drug: insulin  lispro Inject 14-24 Units into the skin 3 (three) times daily.   Insulin  Pen Needle 32G X 4 MM Misc 1 Device by Does not apply route in the morning, at noon, in the evening, and at bedtime.   methimazole  10 MG tablet Commonly known as: TAPAZOLE  Take 2 tablets (20 mg total) by mouth daily.   Omnipod 5 G7 Pods (Gen 5) Misc 1 Device by Does not apply route every other day.   Omnipod 5 G7 Intro (Gen 5) Kit 1 Device by Does not apply route every other day.   Omnipod 5 DexG7G6 Pods Gen 5 Misc Change pod every other day   Omnipod 5 DexG7G6 Intro Gen 5 Kit Change pods every other day    Toujeo  Max SoloStar 300 UNIT/ML Solostar Pen Generic drug: insulin  glargine (2 Unit Dial) Inject 34 Units into the skin at bedtime.  ALLERGIES: No Known Allergies   REVIEW OF SYSTEMS: A comprehensive ROS was conducted with the patient and is negative except as per HPI    OBJECTIVE:   VITAL SIGNS: There were no vitals taken for this visit.   PHYSICAL EXAM:  General: Pt appears well and is in NAD  Neck: General: Supple without adenopathy or carotid bruits. Thyroid : Thyroid  size normal.  No goiter or nodules appreciated.   Lungs: Clear with good BS bilat   Heart: RRR   Extremities:  Lower extremities - No pretibial edema.   Neuro: MS is good with appropriate affect, pt is alert and Ox3    DM foot exam: 12/01/2022  The skin of the feet is intact without sores or ulcerations. The pedal pulses are 2+ on right and 2+ on left. The sensation is intact to a screening 5.07, 10 gram monofilament bilaterally    DATA REVIEWED:  Lab Results  Component Value Date   HGBA1C 10.8 (A) 06/15/2023   HGBA1C 11.0 (A) 12/01/2022   HGBA1C 8.5 (H) 12/13/2021    Latest Reference Range & Units 06/15/23 10:41  Sodium 135 - 146 mmol/L 137  Potassium 3.5 - 5.3 mmol/L 3.8  Chloride 98 - 110 mmol/L 104  CO2 20 - 32 mmol/L 25  Glucose 65 - 99 mg/dL 696 (H)  BUN 7 - 25 mg/dL 12  Creatinine 2.95 - 2.84 mg/dL 1.32 (L)  Calcium  8.6 - 10.2 mg/dL 9.1  BUN/Creatinine Ratio 6 - 22 (calc) 29 (H)  Total CHOL/HDL Ratio <5.0 (calc) 3.3  Cholesterol <200 mg/dL 440  HDL Cholesterol > OR = 50 mg/dL 32 (L)  LDL Cholesterol (Calc) mg/dL (calc) 59  MICROALB/CREAT RATIO <30 mg/g creat 11  Non-HDL Cholesterol (Calc) <130 mg/dL (calc) 73  Triglycerides <150 mg/dL 61    Latest Reference Range & Units 06/15/23 10:41  TSH mIU/L 0.01 (L)  T4,Free(Direct) 0.8 - 1.8 ng/dL 1.9 (H)    Latest Reference Range & Units 06/15/23 10:41  Microalb, Ur mg/dL 1.9  MICROALB/CREAT RATIO <30 mg/g creat 11   Creatinine, Urine 20 - 275 mg/dL 102     In office BG 725 Mg/DL  ASSESSMENT / PLAN / RECOMMENDATIONS:   1) Type 1 Diabetes Mellitus, poorly controlled, Without complications - Most recent A1c of 10.8 %. Goal A1c < 7.0 %.    -Patient continues with poorly controlled diabetes -She has not been checking glucose at home, no glucose data today -She has not been using correction scale -Our CDE attempted to call her and left a voicemail, but the patient states she did not receive a call, will enter a new referral for OmniPod training -I will increase her basal insulin , as her fasting BG 366 Mg/DL -GFR, MA/CR ratio normal    MEDICATIONS: Increase Toujeo  34 units daily Continue Humalog  14 units 3 times daily before every meal Continue correction factor : Humalog  (BG -130/35) 3 times daily before every meal  EDUCATION / INSTRUCTIONS: BG monitoring instructions: Patient is instructed to check her blood sugars 3 times a day, before meals. Call [facility] if: BG persistently < 70  I reviewed the Rule of 15 for the treatment of hypoglycemia in detail with the patient. Literature supplied.   2) Diabetic complications:  Eye: Does not have known diabetic retinopathy.  Neuro/ Feet: Does not have known diabetic peripheral neuropathy. Renal: Patient does not have known baseline CKD. She is not on an ACEI/ARB at present.  3) Hyperthyroidism  -Patient is clinically euthyroid -No local  neck symptoms -She has opted to remain on methimazole  -She is on less methimazole  than previously prescribed, hence biochemical hyperthyroid.  Will increase methimazole  as below   Medication  Increase methimazole  10 mg, 2 tablet daily    4)Graves' Disease:  -No extrathyroidal manifestation of Graves' disease   Follow-up in 4 months  Signed electronically by: Natale Bail, MD  [facility] End[facility]in[facility]  [facility] [address] [place] [place],  [place] [address] Phone: [phone] FAX: [phone]   CC: Renate Caroline, MD [address] [place] [address] Phone: [phone]  Fax: [phone]    Return to Endocrinology clinic as below: Future Appointments  Date Time Provider Department Center  10/20/2023  9:10 AM Jajuan Skoog, Julian Obey, MD [facility] None

## 2023-10-30 ENCOUNTER — Ambulatory Visit: Admitting: Internal Medicine

## 2023-10-30 ENCOUNTER — Encounter: Payer: Self-pay | Admitting: Internal Medicine

## 2023-10-30 VITALS — BP 116/72 | HR 83 | Ht 62.0 in | Wt 146.0 lb

## 2023-10-30 DIAGNOSIS — E1065 Type 1 diabetes mellitus with hyperglycemia: Secondary | ICD-10-CM

## 2023-10-30 DIAGNOSIS — E0500 Thyrotoxicosis with diffuse goiter without thyrotoxic crisis or storm: Secondary | ICD-10-CM

## 2023-10-30 DIAGNOSIS — E0590 Thyrotoxicosis, unspecified without thyrotoxic crisis or storm: Secondary | ICD-10-CM

## 2023-10-30 LAB — POCT GLYCOSYLATED HEMOGLOBIN (HGB A1C): Hemoglobin A1C: 10 % — AB (ref 4.0–5.6)

## 2023-10-30 LAB — POCT GLUCOSE (DEVICE FOR HOME USE): POC Glucose: 233 mg/dL — AB (ref 70–99)

## 2023-10-30 NOTE — Progress Notes (Unsigned)
 Name: Angela Meyer  MRN/ DOB: [id number], 17-Dec-1995   Age/ Sex: 28 y.o., female    PCP: Renate Caroline, MD   Reason for Endocrinology Evaluation: Type 1 Diabetes Mellitus/Hyperthyroidism     Date of Initial Endocrinology Visit: 12/01/2022    PATIENT IDENTIFIER: Angela Meyer is a 28 y.o. female with a past medical history of DM. The patient presented for initial endocrinology clinic visit on 12/01/2022 for consultative assistance with her diabetes management.    HPI: Angela Meyer was    Diagnosed with DM at age 74 Prior Medications tried/Intolerance: She was on a pump early in her diagnosis until age 44                Hemoglobin A1c has ranged from 5.2% in 2012, peaking at 11.0% in 2024.   She already has a Dexcom and OmniPod at home , would like to get training on it  On her initial visit she had an A1c of 11.0%, she was on multiple daily injections of insulin , she already had OmniPods at home and was getting ready to start, we provided her with a correction scale   She was trained on the OmniPod use 07/14/2023  THYROID  HISTORY: Patient has been noted with hyperthyroid since 2008, thyroid  uptake and scan 04/2007 showed homogenous intense uptake of the thyroid  at 96% . Her most recent TSH was suppressed 0.01 u IU/mL on 04/11/2022, with elevated free T4 >5.5 NG/DL.   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 Marcella Serge  She was trained on OmniPod use 06/2023, due to leakage of insulin  while using it    SUBJECTIVE:   During the last visit (06/15/2023): A1c 10.8%  Today (10/30/23): Angela Meyer is here for a follow-up on diabetes and thyroid  management.  She has not been checking glucose on regular basis  nor has she been using the insulin  pump    Continues nausea, but no vomiting Has constipation  Denies local neck swelling  Denies palpitations   This patient  with type 1 diabetes is treated with OmniPod (insulin  pump). During the visit the pump basal and bolus doses were reviewed including carb/insulin  rations and supplemental doses. The clinical list was updated. The glucose meter download was reviewed in detail to determine if the current pump settings are providing the best glycemic control without excessive hypoglycemia.  Pump and meter download:    Pump   OmniPod Settings   Insulin  type   Humalog    Basal rate       0000 0.8 u/h               I:C ratio       0000 1:8     Enter #10 g              Sensitivity       0000  35       Goal       0000  110           Body mass index is 26.7 kg/m.  PUMP STATISTICS:     HOME DIABETES REGIMEN: Toujeo  34 units daily  Humalog  14 units TIDQAC CF: Humalog  (BG -130/35) Methimazole  10 mg, 2 tabs daily    Statin: no ACE-I/ARB: no    DIABETIC COMPLICATIONS: Microvascular complications:  Denies: CKD Last eye exam: Completed 2024  Macrovascular complications:  Denies: CAD, PVD, CVA   PAST HISTORY: Past Medical History:  Past Medical History:  Diagnosis Date  . Chronic autoimmune thyroiditis   . Diabetes mellitus   . Dyspepsia   . Fetal tachycardia before the onset of labor   . Goiter with hyperthyroidism   . Hyperthyroidism   . Hypothyroidism   . Irritability   . Irritability   . Tachycardia   . Thyroiditis, autoimmune   . Thyrotoxicosis with diffuse goiter   . Tremor   . Tremor   . Weight loss   . Weight loss, abnormal    Past Surgical History:  Past Surgical History:  Procedure Laterality Date  . CESAREAN SECTION N/A 12/16/2021   Procedure: CESAREAN SECTION;  Surgeon: Richard Champion A, DO;  Location: [facility];  Service: Obstetrics;  Laterality: N/A;  . NO PAST SURGERIES      Social History:  reports that she has never smoked. She has never used smokeless tobacco. She reports that she does not currently use alcohol. She reports that she does not use  drugs. Family History:  Family History  Problem Relation Age of Onset  . Diabetes Father   . Congenital heart disease Father   . Diabetes Paternal Aunt   . Thyroid  disease Maternal Grandmother   . Heart disease Maternal Grandfather      HOME MEDICATIONS: Allergies as of 10/30/2023   No Known Allergies      Medication List        Accurate as of Oct 30, 2023 12:02 PM. If you have any questions, ask your nurse or doctor.          Dexcom G7 Sensor Misc 1 Device by Does not apply route as directed.   Dexcom G6 Sensor Misc Change sensors every 10 days   HumaLOG  KwikPen 200 UNIT/ML KwikPen Generic drug: insulin  lispro Inject 14-24 Units into the skin 3 (three) times daily.   Insulin  Pen Needle 32G X 4 MM Misc 1 Device by Does not apply route in the morning, at noon, in the evening, and at bedtime.   methimazole  10 MG tablet Commonly known as: TAPAZOLE  Take 2 tablets (20 mg total) by mouth daily.   Omnipod 5 G7 Pods (Gen 5) Misc 1 Device by Does not apply route every other day.   Omnipod 5 G7 Intro (Gen 5) Kit 1 Device by Does not apply route every other day.   Omnipod 5 DexG7G6 Pods Gen 5 Misc Change pod every other day   Omnipod 5 DexG7G6 Intro Gen 5 Kit Change pods every other day   Toujeo  Max SoloStar 300 UNIT/ML Solostar Pen Generic drug: insulin  glargine (2 Unit Dial) Inject 34 Units into the skin at bedtime.         ALLERGIES: No Known Allergies   REVIEW OF SYSTEMS: A comprehensive ROS was conducted with the patient and is negative except as per HPI    OBJECTIVE:   VITAL SIGNS: BP 116/72 (BP Location: Left Arm, Patient Position: Sitting, Cuff Size: Normal)   Pulse 83   Ht 5\' 2"  (1.575 m)   Wt 146 lb (66.2 kg)   SpO2 95%   BMI 26.70 kg/m    PHYSICAL EXAM:  General: Pt appears well and is in NAD  Neck: General: Supple without adenopathy or carotid bruits. Thyroid : Thyroid  gland is prominent, no buits  Lungs: Clear with good BS bilat    Heart: RRR   Extremities:  Lower extremities - No pretibial edema.   Neuro: MS  is good with appropriate affect, pt is alert and Ox3    DM foot exam: 10/30/2023  The skin of the feet is intact without sores or ulcerations. The pedal pulses are 2+ on right and 2+ on left. The sensation is intact to a screening 5.07, 10 gram monofilament bilaterally    DATA REVIEWED:  Lab Results  Component Value Date   HGBA1C 10.8 (A) 06/15/2023   HGBA1C 11.0 (A) 12/01/2022   HGBA1C 8.5 (H) 12/13/2021    Latest Reference Range & Units 10/30/23 12:28  TSH mIU/L 0.47  T4,Free(Direct) 0.8 - 1.8 ng/dL 0.9      Latest Reference Range & Units 06/15/23 10:41  Sodium 135 - 146 mmol/L 137  Potassium 3.5 - 5.3 mmol/L 3.8  Chloride 98 - 110 mmol/L 104  CO2 20 - 32 mmol/L 25  Glucose 65 - 99 mg/dL 409 (H)  BUN 7 - 25 mg/dL 12  Creatinine 8.11 - 9.14 mg/dL 7.82 (L)  Calcium  8.6 - 10.2 mg/dL 9.1  BUN/Creatinine Ratio 6 - 22 (calc) 29 (H)  Total CHOL/HDL Ratio <5.0 (calc) 3.3  Cholesterol <200 mg/dL 956  HDL Cholesterol > OR = 50 mg/dL 32 (L)  LDL Cholesterol (Calc) mg/dL (calc) 59  MICROALB/CREAT RATIO <30 mg/g creat 11  Non-HDL Cholesterol (Calc) <130 mg/dL (calc) 73  Triglycerides <150 mg/dL 61     Latest Reference Range & Units 06/15/23 10:41  Microalb, Ur mg/dL 1.9  MICROALB/CREAT RATIO <30 mg/g creat 11  Creatinine, Urine 20 - 275 mg/dL 213     In office BG 086 Mg/DL  ASSESSMENT / PLAN / RECOMMENDATIONS:   1) Type 1 Diabetes Mellitus, poorly controlled, Without complications - Most recent A1c of 10.0 %. Goal A1c < 7.0 %.    -Patient continues with poorly controlled diabetes - She tried the OmniPod pump but did not do well on it, as per patient it was leaking insulin  and keeps clicking - Patient will continue on multiple daily injections at this time - Her fasting BG this morning 173 Mg/DL, in office BG 578 Mg/DL this is 45 minutes after the meal - I will increase Toujeo  as  below - A referral to ophthalmology has been placed  MEDICATIONS: Increase Toujeo  38 units daily Continue Humalog  14 units 3 times daily before every meal Continue correction factor : Humalog  (BG -130/35) 3 times daily before every meal  EDUCATION / INSTRUCTIONS: BG monitoring instructions: Patient is instructed to check her blood sugars 3 times a day, before meals. Call [facility] if: BG persistently < 70  I reviewed the Rule of 15 for the treatment of hypoglycemia in detail with the patient. Literature supplied.   2) Diabetic complications:  Eye: Does not have known diabetic retinopathy.  Neuro/ Feet: Does not have known diabetic peripheral neuropathy. Renal: Patient does not have known baseline CKD. She is not on an ACEI/ARB at present.  3) Hyperthyroidism  -Patient is clinically euthyroid -No local neck symptoms -She has opted to remain on methimazole  - TFTs are normal  Medication  Continue  methimazole  10 mg, 2 tablet daily    4)Graves' Disease:  -No extrathyroidal manifestation of Graves' disease   Follow-up in 4 months  Patient encouraged to establish with PCP through  [facility] health care      Signed electronically by: Natale Bail, MD  [facility] Endocrinology  [facility] [address] [place] [place], [place] [address] Phone: [phone] FAX: [phone]   CC: Renate Caroline,  MD [address] [place] [address] Phone: [phone]  Fax: [phone]    Return to Endocrinology clinic as below: Future Appointments  Date Time Provider Department Center  10/30/2023 12:10 PM Aleah Ahlgrim, Julian Obey, MD [facility] None

## 2023-10-30 NOTE — Patient Instructions (Signed)
 Increase  Toujeo  38 units daily Humalog  14 units with each meal Humalog  correctional insulin : ADD extra units on insulin  to your meal-time Humalog  dose if your blood sugars are higher than 165. Use the scale below to help guide you:   Blood sugar before meal Number of units to inject  Less than 165 0 unit  166 -  200 1 units  201 -  235 2 units  236 -  270 3 units  271 -  305 4 units  306 -  340 5 units  341 -  375 6 units  376 -  410 7 units  411 -  445 8 units      HOW TO TREAT LOW BLOOD SUGARS (Blood sugar LESS THAN 70 MG/DL) Please follow the RULE OF 15 for the treatment of hypoglycemia treatment (when your (blood sugars are less than 70 mg/dL)   STEP 1: Take 15 grams of carbohydrates when your blood sugar is low, which includes:  3-4 GLUCOSE TABS  OR 3-4 OZ OF JUICE OR REGULAR SODA OR ONE TUBE OF GLUCOSE GEL    STEP 2: RECHECK blood sugar in 15 MINUTES STEP 3: If your blood sugar is still low at the 15 minute recheck --> then, go back to STEP 1 and treat AGAIN with another 15 grams of carbohydrates.

## 2023-10-31 LAB — TSH: TSH: 0.47 m[IU]/L

## 2023-10-31 LAB — T4, FREE: Free T4: 0.9 ng/dL (ref 0.8–1.8)

## 2023-11-03 ENCOUNTER — Ambulatory Visit: Payer: Self-pay | Admitting: Internal Medicine

## 2023-11-03 MED ORDER — METHIMAZOLE 10 MG PO TABS: 20.0000 mg | ORAL_TABLET | Freq: Every day | ORAL | 2 refills | Status: DC

## 2023-11-18 ENCOUNTER — Emergency Department (HOSPITAL_BASED_OUTPATIENT_CLINIC_OR_DEPARTMENT_OTHER): Admitting: Radiology

## 2023-11-18 ENCOUNTER — Encounter (HOSPITAL_BASED_OUTPATIENT_CLINIC_OR_DEPARTMENT_OTHER): Payer: Self-pay | Admitting: Emergency Medicine

## 2023-11-18 ENCOUNTER — Other Ambulatory Visit: Payer: Self-pay

## 2023-11-18 ENCOUNTER — Emergency Department (HOSPITAL_BASED_OUTPATIENT_CLINIC_OR_DEPARTMENT_OTHER)
Admission: EM | Admit: 2023-11-18 | Discharge: 2023-11-18 | Disposition: A | Discharge status: Home / Self Care | Attending: Emergency Medicine | Admitting: Emergency Medicine

## 2023-11-18 DIAGNOSIS — R0789 Other chest pain: Secondary | ICD-10-CM | POA: Diagnosis present

## 2023-11-18 DIAGNOSIS — Z794 Long term (current) use of insulin: Secondary | ICD-10-CM | POA: Insufficient documentation

## 2023-11-18 DIAGNOSIS — E039 Hypothyroidism, unspecified: Secondary | ICD-10-CM | POA: Diagnosis not present

## 2023-11-18 DIAGNOSIS — E119 Type 2 diabetes mellitus without complications: Secondary | ICD-10-CM | POA: Diagnosis not present

## 2023-11-18 LAB — BASIC METABOLIC PANEL WITH GFR
Anion gap: 14 (ref 5–15)
BUN: 11 mg/dL (ref 6–20)
CO2: 24 mmol/L (ref 22–32)
Calcium: 9.6 mg/dL (ref 8.9–10.3)
Chloride: 98 mmol/L (ref 98–111)
Creatinine, Ser: 0.67 mg/dL (ref 0.44–1.00)
GFR, Estimated: >60 mL/min (ref >60)
Glucose, Bld: 283 mg/dL — ABNORMAL HIGH (ref 70–99)
Potassium: 3.8 mmol/L (ref 3.5–5.1)
Sodium: 135 mmol/L (ref 135–145)

## 2023-11-18 LAB — TROPONIN T, HIGH SENSITIVITY
Troponin T High Sensitivity: <15 ng/L (ref <19)
Troponin T High Sensitivity: <15 ng/L (ref <19)

## 2023-11-18 LAB — CBC
HCT: 40.7 % (ref 36.0–46.0)
Hemoglobin: 13.5 g/dL (ref 12.0–15.0)
MCH: 28 pg (ref 26.0–34.0)
MCHC: 33.2 g/dL (ref 30.0–36.0)
MCV: 84.3 fL (ref 80.0–100.0)
Platelets: 359 10*3/uL (ref 150–400)
RBC: 4.83 MIL/uL (ref 3.87–5.11)
RDW: 12.2 % (ref 11.5–15.5)
WBC: 4.7 10*3/uL (ref 4.0–10.5)
nRBC: 0 % (ref 0.0–0.2)

## 2023-11-18 LAB — D-DIMER, QUANTITATIVE: D-Dimer, Quant: <0.27 ug{FEU}/mL (ref 0.00–0.50)

## 2023-11-18 LAB — PREGNANCY, URINE: Preg Test, Ur: NEGATIVE

## 2023-11-18 MED ORDER — HYDROXYZINE HCL 25 MG PO TABS: 25.0000 mg | ORAL_TABLET | Freq: Four times a day (QID) | ORAL | 0 refills | Status: DC

## 2023-11-18 NOTE — ED Provider Notes (Signed)
 [place] EMERGENCY DEPARTMENT AT [facility] Provider Note   CSN: [id number] Arrival date & time: 11/18/23  1452     History  Chief Complaint  Patient presents with   Chest Pain    Angela Meyer is a 28 y.o. female.  28 year old female presents today for concern of chest pain which has been ongoing for the past couple months but states that last night she developed some pressure that carried on over to the morning with some shortness of breath.  Has significantly improved since then.  Endorses lots of life stressors and initially chalked this up to anxiety but since last night she stated since it worsened she wanted to come in and be evaluated.  No history of DVT or PE.  No history of CAD.  The history is provided by the patient. No language interpreter was used.       Home Medications Prior to Admission medications   Medication Sig Start Date End Date Taking? Authorizing Provider  hydrOXYzine (ATARAX) 25 MG tablet Take 1 tablet (25 mg total) by mouth every 6 (six) hours. 11/18/23  Yes Ceclia Cohens, Hanni Milford, PA-C  Continuous Glucose Sensor (DEXCOM G6 SENSOR) MISC Change sensors every 10 days Patient not taking: Reported on 10/30/2023 08/06/23   Shamleffer, Julian Obey, MD  Continuous Glucose Sensor (DEXCOM G7 SENSOR) MISC 1 Device by Does not apply route as directed. Patient not taking: Reported on 10/30/2023 08/06/23   Shamleffer, Ibtehal Jaralla, MD  Insulin  Disposable Pump (OMNIPOD 5 DEXG7G6 INTRO GEN 5) KIT Change pods every other day Patient not taking: Reported on 10/30/2023 08/05/23   Shamleffer, Ibtehal Jaralla, MD  Insulin  Disposable Pump (OMNIPOD 5 DEXG7G6 PODS GEN 5) MISC Change pod every other day Patient not taking: Reported on 10/30/2023 08/05/23   Shamleffer, Ibtehal Jaralla, MD  Insulin  Disposable Pump (OMNIPOD 5 G7 INTRO, GEN 5,) KIT 1 Device by Does not apply route every other day. Patient not taking: Reported on 10/30/2023 06/15/23   Shamleffer, Julian Obey, MD   Insulin  Disposable Pump (OMNIPOD 5 G7 PODS, GEN 5,) MISC 1 Device by Does not apply route every other day. Patient not taking: Reported on 10/30/2023 06/15/23   Shamleffer, Ibtehal Jaralla, MD  insulin  glargine, 2 Unit Dial, (TOUJEO  MAX SOLOSTAR) 300 UNIT/ML Solostar Pen Inject 34 Units into the skin at bedtime. 06/15/23   Shamleffer, Ibtehal Jaralla, MD  insulin  lispro (HUMALOG  KWIKPEN) 200 UNIT/ML KwikPen Inject 14-24 Units into the skin 3 (three) times daily. 09/23/23   Shamleffer, Julian Obey, MD  Insulin  Pen Needle 32G X 4 MM MISC 1 Device by Does not apply route in the morning, at noon, in the evening, and at bedtime. 12/01/22   Shamleffer, Ibtehal Jaralla, MD  methimazole  (TAPAZOLE ) 10 MG tablet Take 2 tablets (20 mg total) by mouth daily. 11/03/23   Shamleffer, Ibtehal Jaralla, MD      Allergies    Patient has no known allergies.    Review of Systems   Review of Systems  Constitutional:  Negative for chills and fever.  Respiratory:  Negative for cough and shortness of breath.   Cardiovascular:  Positive for chest pain.  Gastrointestinal:  Negative for nausea.  Neurological:  Negative for light-headedness.  All other systems reviewed and are negative.   Physical Exam Updated Vital Signs BP 113/83   Pulse (!) 55   Temp 98.1 F (36.7 C) (Oral)   Resp 11   Ht 5\' 2"  (1.575 m)   Wt 63.5 kg   LMP 10/26/2023 (  Approximate)   SpO2 100%   BMI 25.61 kg/m  Physical Exam Vitals and nursing note reviewed.  Constitutional:      General: She is not in acute distress.    Appearance: Normal appearance. She is not ill-appearing.  HENT:     Head: Normocephalic and atraumatic.     Nose: Nose normal.  Eyes:     Conjunctiva/sclera: Conjunctivae normal.  Cardiovascular:     Rate and Rhythm: Normal rate and regular rhythm.  Pulmonary:     Effort: Pulmonary effort is normal. No respiratory distress.     Breath sounds: No wheezing.  Abdominal:     General: There is no distension.      Palpations: Abdomen is soft.     Tenderness: There is no abdominal tenderness. There is no guarding.  Musculoskeletal:        General: No deformity. Normal range of motion.     Cervical back: Normal range of motion.  Skin:    Findings: No rash.  Neurological:     Mental Status: She is alert.     ED Results / Procedures / Treatments   Labs (all labs ordered are listed, but only abnormal results are displayed) Labs Reviewed  BASIC METABOLIC PANEL WITH GFR - Abnormal; Notable for the following components:      Result Value   Glucose, Bld 283 (*)    All other components within normal limits  CBC  PREGNANCY, URINE  D-DIMER, QUANTITATIVE  TROPONIN T, HIGH SENSITIVITY  TROPONIN T, HIGH SENSITIVITY    EKG EKG Interpretation Date/Time:  Wednesday Nov 18 2023 15:15:26 EDT Ventricular Rate:  62 PR Interval:  166 QRS Duration:  81 QT Interval:  401 QTC Calculation: 408 R Axis:   81  Text Interpretation: Sinus rhythm Confirmed by Lowery Rue [phone]) on 11/18/2023 3:31:29 PM  Radiology DG Chest 2 View Result Date: 11/18/2023 CLINICAL DATA:  Chest pain for 2 months. EXAM: CHEST - 2 VIEW COMPARISON:  04/19/2022. FINDINGS: Normal heart, mediastinum and hila. Clear lungs.  No pleural effusion or pneumothorax. Skeletal structures are unremarkable. IMPRESSION: Normal chest radiographs. Electronically Signed   By: Amanda Jungling M.D.   On: 11/18/2023 16:45    Procedures Procedures    Medications Ordered in ED Medications - No data to display  ED Course/ Medical Decision Making/ A&P                                 Medical Decision Making Amount and/or Complexity of Data Reviewed Labs: ordered. Radiology: ordered.  Risk Prescription drug management.   Medical Decision Making / ED Course   This patient presents to the ED for concern of chest pain, this involves an extensive number of treatment options, and is a complaint that carries with it a high risk of complications  and morbidity.  The differential diagnosis includes ACS, PE, pneumonia, MSK pain, GERD, anxiety  MDM: 28 year old female presents today for concern of chest pain.  Ongoing for the past 2 months however worse since last night.  Overall well-appearing.  Hemodynamically stable.  CBC unremarkable, BMP with glucose of 283 otherwise without acute concern.  Troponin, D-dimer within normal, pregnancy test negative.  EKG without acute ischemic change, chest x-ray without acute cardiopulmonary process.  Discharged in stable condition.  Return precaution discussed.  Patient voices understanding and is in agreement with plan.  Lab Tests: -I ordered, reviewed, and interpreted labs.   The pertinent  results include:   Labs Reviewed  BASIC METABOLIC PANEL WITH GFR - Abnormal; Notable for the following components:      Result Value   Glucose, Bld 283 (*)    All other components within normal limits  CBC  PREGNANCY, URINE  D-DIMER, QUANTITATIVE  TROPONIN T, HIGH SENSITIVITY  TROPONIN T, HIGH SENSITIVITY      EKG  EKG Interpretation Date/Time:  Wednesday Nov 18 2023 15:15:26 EDT Ventricular Rate:  62 PR Interval:  166 QRS Duration:  81 QT Interval:  401 QTC Calculation: 408 R Axis:   81  Text Interpretation: Sinus rhythm Confirmed by Lowery Rue [phone] on 11/18/2023 3:31:29 PM         Imaging Studies ordered: I ordered imaging studies including chest x-ray I independently visualized and interpreted imaging. I agree with the radiologist interpretation   Medicines ordered and prescription drug management: Meds ordered this encounter  Medications   hydrOXYzine (ATARAX) 25 MG tablet    Sig: Take 1 tablet (25 mg total) by mouth every 6 (six) hours.    Dispense:  12 tablet    Refill:  0    Supervising Provider:   Annabell Key, BRIAN [[id number]]    -I have reviewed the patients home medicines and have made adjustments as needed     Reevaluation: After the interventions noted above, I  reevaluated the patient and found that they have :improved  Co morbidities that complicate the patient evaluation  Past Medical History:  Diagnosis Date   Chronic autoimmune thyroiditis    Diabetes mellitus    Dyspepsia    Fetal tachycardia before the onset of labor    Goiter with hyperthyroidism    Hyperthyroidism    Hypothyroidism    Irritability    Irritability    Tachycardia    Thyroiditis, autoimmune    Thyrotoxicosis with diffuse goiter    Tremor    Tremor    Weight loss    Weight loss, abnormal       Dispostion: Discharged in stable condition.  Return precaution discussed.  Atarax given to keep on hand given she references anxiety.  Final Clinical Impression(s) / ED Diagnoses Final diagnoses:  Atypical chest pain    Rx / DC Orders ED Discharge Orders          Ordered    Ambulatory referral to Cardiology       Comments: If you have not heard from the Cardiology office within the next 72 hours please call [phone].   11/18/23 1851    hydrOXYzine (ATARAX) 25 MG tablet  Every 6 hours        11/18/23 1851              Lucina Sabal, PA-C 11/18/23 2005    Lowery Rue, DO 11/18/23 2035

## 2023-11-18 NOTE — Discharge Instructions (Signed)
 Your workup today is reassuring.  No concerning cause of chest pain.  I have given you a referral to cardiology since you are not able to get in with your primary care provider.  I have also sent in some anxiety medicine called hydroxyzine.  You can take this as needed every 8 hours.  Return for any emergent symptoms.

## 2023-11-18 NOTE — ED Triage Notes (Signed)
 Pt via pov from home with chest pain x 2 months; states she has had trouble getting an appt with pcp. States the pain was different last night and this morning - "I couldn't breathe." Pt denies n/v. Endorses shoulder pain and headache. Pt alert & oriented, nad noted.

## 2023-11-18 NOTE — ED Notes (Signed)
 Discharge paperwork given and verbally understood.

## 2023-12-24 ENCOUNTER — Ambulatory Visit: Admitting: Internal Medicine

## 2023-12-24 ENCOUNTER — Encounter: Payer: Self-pay | Admitting: Internal Medicine

## 2023-12-24 VITALS — BP 100/68 | HR 73 | Temp 98.6°F | Ht 62.0 in | Wt 143.8 lb

## 2023-12-24 DIAGNOSIS — R079 Chest pain, unspecified: Secondary | ICD-10-CM | POA: Diagnosis not present

## 2023-12-24 DIAGNOSIS — E1065 Type 1 diabetes mellitus with hyperglycemia: Secondary | ICD-10-CM | POA: Diagnosis not present

## 2023-12-24 DIAGNOSIS — F411 Generalized anxiety disorder: Secondary | ICD-10-CM | POA: Diagnosis not present

## 2023-12-24 DIAGNOSIS — E0500 Thyrotoxicosis with diffuse goiter without thyrotoxic crisis or storm: Secondary | ICD-10-CM | POA: Diagnosis not present

## 2023-12-24 MED ORDER — HYDROXYZINE HCL 25 MG PO TABS
25.0000 mg | ORAL_TABLET | Freq: Four times a day (QID) | ORAL | 2 refills | Status: AC | PRN
Start: 2023-12-24 — End: ?

## 2023-12-24 NOTE — Progress Notes (Signed)
 [facility] PRIMARY CARE [facility] [address] [place] [address] Dept: [phone] Dept Fax: [phone]  New Patient Office Visit  Subjective:   Angela Meyer 02/22/1996 12/24/2023  Chief Complaint  Patient presents with   Establish Care    HPI: Angela Meyer presents today to establish care at [facility]. Introduced to Publishing rights manager role and practice setting.  All questions answered.  Concerns: See below   Discussed the use of AI scribe software for clinical note transcription with the patient, who gave verbal consent to proceed.  History of Present Illness   Angela Meyer is a 28 year old female with type 1 diabetes and hyperthyroidism who presents to establish care as a new patient.  She has type 1 diabetes managed with Humalog  and Toujeo . She no longer uses an insulin  pump or Dexcom sensors, opting instead for a glucose meter. Her last A1c was 10% about a month ago. She is working to improve this by monitoring her diet, insulin  use, and checking her blood sugar three times a day and at bedtime. T1DM managed by endocrinology.   She has hyperthyroidism due to Grave's disease and is currently taking methimazole  20 mg once daily. Her endocrinologist monitors her TSH and T4 levels regularly.  Recently, she experienced chest pain severe enough to warrant an ER visit, where she was given hydroxyzine , which she has not taken regularly due to limited supply. 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 Hydroxyzine  helps her sleep and relax at night, though she avoids taking it during the day due to drowsiness. Since trying the medication on few occasions, she does note improvement of CP, not waking up with it when she gets up in the morning.   She has a history of iron   deficiency anemia during pregnancy, which is not currently an issue.       The following portions of the patient's history were reviewed and updated as appropriate: past medical history, past surgical history, family history, social history, allergies, medications, and problem list.   Patient Active Problem List   Diagnosis Date Noted   Generalized anxiety disorder 12/24/2023   DKA (diabetic ketoacidosis) (HCC) 04/19/2022   Status post primary low transverse cesarean section 6/26 12/16/2021   Postpartum care following cesarean delivery 6/26 12/16/2021   Severe preeclampsia 12/16/2021   Iron  deficiency anemia 12/16/2021   Type 1 diabetes mellitus (HCC) 11/26/2010   Hyperthyroidism 10/14/2010   Past Medical History:  Diagnosis Date   Chronic autoimmune thyroiditis    Diabetes mellitus    Dyspepsia    Fetal tachycardia before the onset of labor    Goiter with hyperthyroidism    Hyperthyroidism    Hypothyroidism    Irritability    Irritability    Tachycardia    Thyroiditis, autoimmune    Thyrotoxicosis with diffuse goiter    Tremor    Tremor    Weight loss    Weight loss, abnormal    Past Surgical History:  Procedure Laterality Date   CESAREAN SECTION N/A 12/16/2021   Procedure: CESAREAN SECTION;  Surgeon: Rendell Calton LABOR, DO;  Location: [facility] LD ORS;  Service: Obstetrics;  Laterality: N/A;   NO PAST SURGERIES     Family History  Problem Relation Age of Onset   Diabetes Father    Congenital heart disease Father  Diabetes Paternal Aunt    Thyroid  disease Maternal Grandmother    Heart disease Maternal Grandfather     Current Outpatient Medications:    insulin  glargine, 2 Unit Dial, (TOUJEO  MAX SOLOSTAR) 300 UNIT/ML Solostar Pen, Inject 34 Units into the skin at bedtime., Disp: 15 mL, Rfl: 3   insulin  lispro (HUMALOG  KWIKPEN) 200 UNIT/ML KwikPen, Inject 14-24 Units into the skin 3 (three) times daily., Disp: 30 mL, Rfl: 2   Insulin  Pen Needle 32G X 4 MM MISC, 1 Device by  Does not apply route in the morning, at noon, in the evening, and at bedtime., Disp: 400 each, Rfl: 2   methimazole  (TAPAZOLE ) 10 MG tablet, Take 2 tablets (20 mg total) by mouth daily., Disp: 180 tablet, Rfl: 2   Continuous Glucose Sensor (DEXCOM G7 SENSOR) MISC, 1 Device by Does not apply route as directed. (Patient not taking: Reported on 12/24/2023), Disp: 9 each, Rfl: 3   hydrOXYzine  (ATARAX ) 25 MG tablet, Take 1 tablet (25 mg total) by mouth every 6 (six) hours as needed for anxiety., Disp: 30 tablet, Rfl: 2 No Known Allergies  ROS: A complete ROS was performed with pertinent positives/negatives noted in the HPI. The remainder of the ROS are negative.   Objective:   Today's Vitals   12/24/23 0922  BP: 100/68  Pulse: 73  Temp: 98.6 F (37 C)  TempSrc: Temporal  SpO2: 99%  Weight: 143 lb 12.8 oz (65.2 kg)  Height: 5' 2 (1.575 m)    GENERAL: Well-appearing, in NAD. Well nourished.  SKIN: Pink, warm and dry. No rash, lesion, ulceration, or ecchymoses.  NECK: Trachea midline. Full ROM w/o pain or tenderness. No lymphadenopathy.  RESPIRATORY: Chest wall symmetrical. Respirations even and non-labored. Breath sounds clear to auscultation bilaterally.  CARDIAC: S1, S2 present, regular rate and rhythm. Peripheral pulses 2+ bilaterally.  EXTREMITIES: Without clubbing, cyanosis, or edema.  NEUROLOGIC:  Steady, even gait.  PSYCH/MENTAL STATUS: Alert, oriented x 3. Cooperative, appropriate mood and affect.   Health Maintenance Due  Topic Date Due   HIV Screening  Never done   Hepatitis C Screening  Never done   DTaP/Tdap/Td (7 - Td or Tdap) 12/17/2021   OPHTHALMOLOGY EXAM  08/28/2023   Cervical Cancer Screening (HPV/Pap Cotest)  Never done    No results found for any visits on 12/24/23.  Assessment & Plan:  Assessment and Plan    Generalized Anxiety Disorder  - Likely anxiety contributing to CP  - Rx refill for hydroxyzine  25mg  q6hr PRN anxiety  - continue counseling  -  patient will notify provider if anxiety worsens and will need further medication intervention  Chest Pain Intermittent chest pain likely due to stress and anxiety. Severe episodes led to ER visit. Hydroxyzine  aids sleep and relaxation. Stress from job as Child psychotherapist contributes. Cardiologist follow-up scheduled. - Prescribe hydroxyzine  25 mg every six hours as needed for anxiety, preferably at night. - Continue follow-up with cardiology as scheduled next week. - Consider anxiety management options post-cardiology evaluation, including counseling or medication if needed.  Type 1 Diabetes Mellitus Type 1 diabetes managed with Humalog  and Toujeo . Recent A1c 10, indicating suboptimal control. Working on Futures trader through diet, insulin , and regular glucose monitoring. - Continue current diabetes management with endocrinology - Monitor blood glucose levels three times a day and at bedtime.  Grave's Disease Grave's disease managed with methimazole  20 mg once daily. Endocrinologist monitors TSH and T4 levels. - Continue methimazole  20 mg once daily. - Continue follow-up with  endocrinologist for TSH and T4 monitoring.   Meds ordered this encounter  Medications   hydrOXYzine  (ATARAX ) 25 MG tablet    Sig: Take 1 tablet (25 mg total) by mouth every 6 (six) hours as needed for anxiety.    Dispense:  30 tablet    Refill:  2    Supervising Provider:   THOMPSON, AARON B [[id number]]    Return in about 6 weeks (around 02/04/2024) for Annual Physical Exam with fasting lab work.   Rosina Senters, FNP

## 2023-12-29 ENCOUNTER — Ambulatory Visit: Admitting: Cardiology

## 2023-12-29 NOTE — Progress Notes (Deleted)
  Cardiology Office Note:  .   Date:  12/29/2023  ID:  Angela A Roig, DOB April 09, 1996, MRN [id number] PCP: Billy Knee, FNP  [facility] Providers Cardiologist:  Newman Lawrence, MD PCP: Billy Knee, FNP  No chief complaint on file.    Angela Meyer is a 28 y.o. female with h/o Graves' disease, insulin -dependent type 1 diabetes mellitus   Discussed the use of AI scribe software for clinical note transcription with the patient, who gave verbal consent to proceed.  History of Present Illness       There were no vitals filed for this visit.    ROS      Studies Reviewed: .        *** Labs ***/202***: Chol ***, TG ***, HDL ***, LDL *** HbA1C ***% Hb *** Cr *** ***  Risk Assessment/Calculations:   {Does this patient have ATRIAL FIBRILLATION?[phone]}    Physical Exam   VISIT DIAGNOSES: No diagnosis found.   Angela A Waskey is a 28 y.o. female with h/o Graves' disease, insulin -dependent type 1 diabetes mellitus  Assessment & Plan       {Are you ordering a CV Procedure (e.g. stress test, cath, DCCV, TEE, etc)?   Press F2        :[id number]}    No orders of the defined types were placed in this encounter.    F/u in ***  Signed, Newman JINNY Lawrence, MD

## 2024-01-13 ENCOUNTER — Encounter: Payer: Self-pay | Admitting: Internal Medicine

## 2024-01-21 ENCOUNTER — Ambulatory Visit: Admitting: Cardiology

## 2024-01-31 ENCOUNTER — Other Ambulatory Visit: Payer: Self-pay

## 2024-01-31 ENCOUNTER — Emergency Department (HOSPITAL_COMMUNITY)
Admission: EM | Admit: 2024-01-31 | Discharge: 2024-01-31 | Disposition: A | Discharge status: Home / Self Care | Attending: Emergency Medicine | Admitting: Emergency Medicine

## 2024-01-31 ENCOUNTER — Emergency Department (HOSPITAL_COMMUNITY)

## 2024-01-31 ENCOUNTER — Encounter (HOSPITAL_COMMUNITY): Payer: Self-pay

## 2024-01-31 DIAGNOSIS — R55 Syncope and collapse: Secondary | ICD-10-CM | POA: Insufficient documentation

## 2024-01-31 DIAGNOSIS — R072 Precordial pain: Secondary | ICD-10-CM | POA: Insufficient documentation

## 2024-01-31 DIAGNOSIS — Z794 Long term (current) use of insulin: Secondary | ICD-10-CM | POA: Diagnosis not present

## 2024-01-31 DIAGNOSIS — R0602 Shortness of breath: Secondary | ICD-10-CM | POA: Diagnosis not present

## 2024-01-31 DIAGNOSIS — Z79899 Other long term (current) drug therapy: Secondary | ICD-10-CM | POA: Insufficient documentation

## 2024-01-31 DIAGNOSIS — E1065 Type 1 diabetes mellitus with hyperglycemia: Secondary | ICD-10-CM | POA: Insufficient documentation

## 2024-01-31 LAB — HCG, SERUM, QUALITATIVE: Preg, Serum: NEGATIVE

## 2024-01-31 LAB — BASIC METABOLIC PANEL WITH GFR
Anion gap: 12 (ref 5–15)
BUN: 6 mg/dL (ref 6–20)
CO2: 21 mmol/L — ABNORMAL LOW (ref 22–32)
Calcium: 8.9 mg/dL (ref 8.9–10.3)
Chloride: 103 mmol/L (ref 98–111)
Creatinine, Ser: 0.66 mg/dL (ref 0.44–1.00)
GFR, Estimated: >60 mL/min (ref >60)
Glucose, Bld: 390 mg/dL — ABNORMAL HIGH (ref 70–99)
Potassium: 3.9 mmol/L (ref 3.5–5.1)
Sodium: 136 mmol/L (ref 135–145)

## 2024-01-31 LAB — CBC
HCT: 37.8 % (ref 36.0–46.0)
Hemoglobin: 12.5 g/dL (ref 12.0–15.0)
MCH: 28.3 pg (ref 26.0–34.0)
MCHC: 33.1 g/dL (ref 30.0–36.0)
MCV: 85.5 fL (ref 80.0–100.0)
Platelets: 315 K/uL (ref 150–400)
RBC: 4.42 MIL/uL (ref 3.87–5.11)
RDW: 12.6 % (ref 11.5–15.5)
WBC: 5.8 K/uL (ref 4.0–10.5)
nRBC: 0 % (ref 0.0–0.2)

## 2024-01-31 LAB — TROPONIN I (HIGH SENSITIVITY): Troponin I (High Sensitivity): <2 ng/L (ref <18)

## 2024-01-31 NOTE — Discharge Instructions (Signed)
 You were seen today for chest pain and near fainting.  Suspect this is likely secondary to the some sort of arrhythmia or anxiety.  However would recommend continued follow-up with cardiology further evaluation.  However denies need to come back to ED for having new or worsening symptoms.

## 2024-01-31 NOTE — ED Triage Notes (Signed)
 Pt here via [facility] from home for acute onset chest pain accompanied by diaphoresis. Pain increases with movement and inspiration.  Pt has been worked up at [facility] for chest pain and is waiting to see cards. Was scheduled to see cards on the 31st, but the office called and canceled the day of.   Pt given 2 SL nitroglycerin and 324 asa with pain decreasing from 10 to an 8.bp 108/78 Hr 80 Sats 98% Rr 16 Cbg 379  Given 500 ns en-route for hyperglycemia

## 2024-01-31 NOTE — ED Provider Notes (Signed)
 [place] EMERGENCY DEPARTMENT AT [facility] Provider Note   CSN: [id number] Arrival date & time: 01/31/24  2021     Patient presents with: Chest Pain   Angela Meyer is a 28 y.o. female.  Chest Pain Associated symptoms: shortness of breath    Patient is a 28 year old female to the ED today for concerns for chest pain accompanied with shortness of breath and near syncope, there is been previously seen for this in May and seen is felt similar however was slightly more severe today, accompanied with diaphoresis as well as central sternal chest pain that was worse than prior.  Stated that she has been having this off and on, intermittent pain for the last several months.  Had appointment cardiology however cardiology canceled last minute and is waiting to get rescheduled.  No family history of early heart death or early death or blood clots, no prior history of blood clots.  Denies fever, headache, vision changes, hemoptysis, long trips, abdominal pain, nausea, vomiting, diarrhea, dysuria, lower leg swelling.    Prior to Admission medications   Medication Sig Start Date End Date Taking? Authorizing Provider  Continuous Glucose Sensor (DEXCOM G7 SENSOR) MISC 1 Device by Does not apply route as directed. Patient not taking: Reported on 12/24/2023 08/06/23   Shamleffer, Ibtehal Jaralla, MD  hydrOXYzine  (ATARAX ) 25 MG tablet Take 1 tablet (25 mg total) by mouth every 6 (six) hours as needed for anxiety. 12/24/23   Billy Knee, FNP  insulin  glargine, 2 Unit Dial, (TOUJEO  MAX SOLOSTAR) 300 UNIT/ML Solostar Pen Inject 34 Units into the skin at bedtime. 06/15/23   Shamleffer, Ibtehal Jaralla, MD  insulin  lispro (HUMALOG  KWIKPEN) 200 UNIT/ML KwikPen Inject 14-24 Units into the skin 3 (three) times daily. 09/23/23   Shamleffer, Donell Cardinal, MD  Insulin  Pen Needle 32G X 4 MM MISC 1 Device by Does not apply route in the morning, at noon, in the evening, and at bedtime. 12/01/22    Shamleffer, Ibtehal Jaralla, MD  methimazole  (TAPAZOLE ) 10 MG tablet Take 2 tablets (20 mg total) by mouth daily. 11/03/23   Shamleffer, Ibtehal Jaralla, MD    Allergies: Patient has no known allergies.    Review of Systems  Respiratory:  Positive for shortness of breath.   Cardiovascular:  Positive for chest pain.  All other systems reviewed and are negative.   Updated Vital Signs BP 106/80   Pulse 80   Temp 98.2 F (36.8 C)   Resp (!) 23   Ht 5' 2 (1.575 m)   Wt 65.2 kg   LMP 01/18/2024   SpO2 100%   BMI 26.29 kg/m   Physical Exam Vitals and nursing note reviewed.  Constitutional:      General: She is not in acute distress.    Appearance: Normal appearance. She is not ill-appearing or diaphoretic.  HENT:     Head: Normocephalic and atraumatic.  Eyes:     General: No scleral icterus.       Right eye: No discharge.        Left eye: No discharge.     Extraocular Movements: Extraocular movements intact.     Conjunctiva/sclera: Conjunctivae normal.  Cardiovascular:     Rate and Rhythm: Normal rate and regular rhythm.     Pulses: Normal pulses.     Heart sounds: Normal heart sounds. No murmur heard.    No friction rub. No gallop.  Pulmonary:     Effort: Pulmonary effort is normal. No respiratory distress.  Breath sounds: No stridor. No wheezing, rhonchi or rales.  Chest:     Chest wall: No tenderness.  Abdominal:     General: Abdomen is flat. There is no distension.     Palpations: Abdomen is soft.     Tenderness: There is no abdominal tenderness. There is no right CVA tenderness, left CVA tenderness, guarding or rebound.  Musculoskeletal:        General: No swelling, deformity or signs of injury.     Cervical back: Normal range of motion. No rigidity.     Right lower leg: No edema.     Left lower leg: No edema.  Skin:    General: Skin is warm and dry.     Findings: No bruising, erythema or lesion.  Neurological:     General: No focal deficit present.      Mental Status: She is alert and oriented to person, place, and time. Mental status is at baseline.     Sensory: No sensory deficit.     Motor: No weakness.     Coordination: Coordination normal.  Psychiatric:        Mood and Affect: Mood normal.     (all labs ordered are listed, but only abnormal results are displayed) Labs Reviewed  BASIC METABOLIC PANEL WITH GFR - Abnormal; Notable for the following components:      Result Value   CO2 21 (*)    Glucose, Bld 390 (*)    All other components within normal limits  CBC  HCG, SERUM, QUALITATIVE  TROPONIN I (HIGH SENSITIVITY)  TROPONIN I (HIGH SENSITIVITY)    EKG: None  Radiology: DG Chest 2 View Result Date: 01/31/2024 CLINICAL DATA:  Central chest pain for 2 months EXAM: CHEST - 2 VIEW COMPARISON:  11/18/2023 FINDINGS: The heart size and mediastinal contours are within normal limits. Both lungs are clear. The visualized skeletal structures are unremarkable. IMPRESSION: No active cardiopulmonary disease. Electronically Signed   By: Oneil Devonshire M.D.   On: 01/31/2024 21:41    Procedures   Medications Ordered in the ED - No data to display          HEART Score: 2                  PERC Score: 0, PERC Score Interpretation: No need for further workup, as <2% chance of PE.  If no criteria are positive and clinicians pre-test probability is <15%, PERC Rule criteria are satisfied Medical Decision Making Amount and/or Complexity of Data Reviewed Labs: ordered. Radiology: ordered.   This patient is a 28 year old female who presents to the ED for concern of acute chest pain, near syncope that began while she was walking to the car today, worse with exertion and lasting for approximately 20 minutes before EMS arrived and provided nitroglycerin which she said helped abate pain.  Seen previously in the ER in May and had appoint with cardiology but had appointment canceled last second due to scheduling conflicts.  On physical exam, patient  is in no acute distress, afebrile, alert and orient x 4, speaking in full sentences, nontachypneic, nontachycardic.  LCTAB, RRR, no murmur, no abdominal tenderness to palpation, no CVA tenderness, no lower leg edema, unremarkable exam otherwise.  PERC negative.  Lab work did note an elevated blood sugar, patient said that she had just eaten and not taken her insulin  with her being type I diabetic.  With labs imaging otherwise being unremarkable.  Patient said that she did not wish to  wait for the second troponin and wished to go home at this time with cardiology follow-up.  This is reasonable at this time with low suspicion for ACS, PE, any other cardiopulmonary emergency.  Will have her follow-up with cardiology and return for new or worsening symptoms as well as take her home insulin .  Patient vital signs have remained stable throughout the course of patient's time in the ED. Low suspicion for any other emergent pathology at this time. I believe this patient is safe to be discharged. Provided strict return to ER precautions. Patient expressed agreement and understanding of plan. All questions were answered.    Differential diagnoses prior to evaluation: The emergent differential diagnosis includes, but is not limited to, ACS, AAS, Pulmonary Embolism, Tension Pneumothorax, Esophageal Rupture, Cardiac Tamponade, Pericarditis, Myocarditis, Pneumothorax, Pneumonia, Aortic Stenosis, CHF Exacerbation, GERD,  Esophageal Spasm,  Mallory-Weiss, Costochondritis, Musculoskeletal Chest Wall Pain, Anxiety / Panic Attack. This is not an exhaustive differential.   Past Medical History / Co-morbidities / Social History: Type 1 diabetes, DKA, thyrotoxicosis, thyroiditis  Additional history: Chart reviewed. Pertinent results include:  Last seen on 11/10/2023 for atypical chest pain and had negative D-dimer as well as negative chest pain workup at that time.  Cardiology follow-up pending   Lab Tests/Imaging  studies: I personally interpreted labs/imaging and the pertinent results include:   CBC unremarkable BMP shows elevated glucose 390, secondary to her having just eaten and not taking her insulin , troponin negative, ECG updated negative Chest x-ray negative .  I agree with the radiologist interpretation.  Cardiac monitoring: EKG obtained and interpreted by myself and attending physician which shows: Sinus rhythm with some nonspecific T wave abnormalities     Medications:  I have reviewed the patients home medicines and have made adjustments as needed.  Critical Interventions: None  Social Determinants of Health: Has good cardiology follow-up  Disposition: After consideration of the diagnostic results and the patients response to treatment, I feel that the patient would benefit from discharge and treatment as above.   emergency department workup does not suggest an emergent condition requiring admission or immediate intervention beyond what has been performed at this time. The plan is: Follow-up with cardiology, return for new or worsening symptoms. The patient is safe for discharge and has been instructed to return immediately for worsening symptoms, change in symptoms or any other concerns.   Final diagnoses:  Precordial pain  Near syncope    ED Discharge Orders     None          Beola Terrall GORMAN DEVONNA 01/31/24 [id number]    Patt Alm Macho, MD 02/04/24 [phone]

## 2024-02-11 NOTE — Progress Notes (Unsigned)
 Subjective:   Angela Meyer 1995/07/01  02/12/2024   CC: No chief complaint on file.   HPI: Angela Meyer is a 28 y.o. female who presents for a routine health maintenance exam.  Labs *** collected at time of visit.    HEALTH SCREENINGS: - Pap smear: {Blank single:19197::pap done,not applicable,up to date,done elsewhere} - Mammogram (40+): Not applicable  - Colonoscopy (45+): Not applicable  - Bone Density (65+): Not applicable  - Lung CA screening with low-dose CT:  Not applicable Adults age 82-80 who are current cigarette smokers or quit within the last 15 years. Must have 20 pack year history.   IMMUNIZATIONS: - Tdap: Tetanus vaccination status reviewed: {tetanus status:315746}. - Influenza: Postponed to flu season - Zostavax (50+): Not applicable   Past medical history, surgical history, medications, allergies, family history and social history reviewed with patient today and changes made to appropriate areas of the chart.   Social History   Socioeconomic History   Marital status: Single    Spouse name: Not on file   Number of children: Not on file   Years of education: Not on file   Highest education level: Not on file  Occupational History   Not on file  Tobacco Use   Smoking status: Never   Smokeless tobacco: Never  Vaping Use   Vaping status: Every Day  Substance and Sexual Activity   Alcohol use: Not Currently    Comment: occasionally   Drug use: No   Sexual activity: Yes  Other Topics Concern   Not on file  Social History Narrative   Currently pregnant with 1st child.   Social Drivers of Corporate investment banker Strain: Not on File (02/13/2018)   Received from [employer]    Financial Resource Strain: 0  Food Insecurity: Low Risk  (07/18/2023)   Received from [facility]   Hunger Vital Sign    Within the past 12 months, you worried that your food would run out before you got money to buy more: Never true     Within the past 12 months, the food you bought just didn't last and you didn't have money to get more. : Never true  Transportation Needs: No Transportation Needs (07/18/2023)   Received from [facility]    In the past 12 months, has lack of reliable transportation kept you from medical appointments, meetings, work or from getting things needed for daily living? : No  Physical Activity: Not on File (02/13/2018)   Received from [facility]   Physical Activity    Physical Activity: 0  Stress: Not on File (02/13/2018)   Received from [facility]   Stress    Stress: 0  Social Connections: Unknown (10/24/2021)   Received from [facility]   Social Network    Social Network: Not on file  Intimate Partner Violence: Unknown (09/26/2021)   Received from [facility]   HITS    Physically Hurt: Not on file    Insult or Talk Down To: Not on file    Threaten Physical Harm: Not on file    Scream or Curse: Not on file     Past Medical History:  Diagnosis Date   Chronic autoimmune thyroiditis    Diabetes mellitus    Dyspepsia    Fetal tachycardia before the onset of labor    Goiter with hyperthyroidism    Hyperthyroidism    Hypothyroidism    Irritability    Irritability  Tachycardia    Thyroiditis, autoimmune    Thyrotoxicosis with diffuse goiter    Tremor    Tremor    Weight loss    Weight loss, abnormal     Past Surgical History:  Procedure Laterality Date   CESAREAN SECTION N/A 12/16/2021   Procedure: CESAREAN SECTION;  Surgeon: Rendell Calton LABOR, DO;  Location: [facility] LD ORS;  Service: Obstetrics;  Laterality: N/A;   NO PAST SURGERIES      Current Outpatient Medications on File Prior to Visit  Medication Sig   Continuous Glucose Sensor (DEXCOM G7 SENSOR) MISC 1 Device by Does not apply route as directed. (Patient not taking: Reported on 12/24/2023)   hydrOXYzine  (ATARAX ) 25 MG tablet Take 1 tablet (25 mg total) by mouth every 6 (six) hours as needed for anxiety.   insulin   glargine, 2 Unit Dial, (TOUJEO  MAX SOLOSTAR) 300 UNIT/ML Solostar Pen Inject 34 Units into the skin at bedtime.   insulin  lispro (HUMALOG  KWIKPEN) 200 UNIT/ML KwikPen Inject 14-24 Units into the skin 3 (three) times daily.   Insulin  Pen Needle 32G X 4 MM MISC 1 Device by Does not apply route in the morning, at noon, in the evening, and at bedtime.   methimazole  (TAPAZOLE ) 10 MG tablet Take 2 tablets (20 mg total) by mouth daily.   No current facility-administered medications on file prior to visit.    No Known Allergies  Family History  Problem Relation Age of Onset   Diabetes Father    Congenital heart disease Father    Diabetes Paternal Aunt    Thyroid  disease Maternal Grandmother    Heart disease Maternal Grandfather      ROS: Denies fever, fatigue, unexplained weight loss/gain, hearing or vision changes, cardiac or respiratory complaints. Denies neurological deficits, musculoskeletal complaints, gastrointestinal or genitourinary complaints, mental health complaints, and skin changes.   Objective:   There were no vitals filed for this visit.  GENERAL APPEARANCE: Well-appearing, in NAD. Well nourished.  SKIN: Pink, warm and dry. Turgor normal. No rash, lesion, ulceration, or ecchymoses. Hair evenly distributed.  HEENT: HEAD: Normocephalic.  EYES: PERRLA. EOMI. Lids intact w/o defect. Sclera white, Conjunctiva pink w/o exudate.  EARS: External ear w/o redness, swelling, masses or lesions. EAC clear. TM's intact, translucent w/o bulging, appropriate landmarks visualized. Appropriate acuity to conversational tones.  NOSE: Septum midline w/o deformity. Nares patent, mucosa pink and non-inflamed w/o drainage. No sinus tenderness.  THROAT: Uvula midline. Oropharynx clear. Tonsils non-inflamed w/o exudate ***. Oral mucosa pink and moist.  NECK: Supple, Trachea midline. Full ROM w/o pain or tenderness. No lymphadenopathy. Thyroid  non-tender w/o enlargement or palpable masses.  BREASTS:  Breasts pendulous, symmetrical, and w/o palpable masses. Nipples everted and w/o discharge. No rash or skin retraction. No axillary or supraclavicular lymphadenopathy.  RESPIRATORY: Chest wall symmetrical w/o masses. Respirations even and non-labored. Breath sounds clear to auscultation bilaterally. No wheezes, rales, rhonchi, or crackles. CARDIAC: S1, S2 present, regular rate and rhythm. No gallops, murmurs, rubs, or clicks. No carotid bruits. Capillary refill <2 seconds. Peripheral pulses 2+ bilaterally. GI: Abdomen soft w/o distention. Normoactive bowel sounds. No palpable masses or tenderness. No guarding or rebound tenderness. Liver and spleen w/o tenderness or enlargement. No CVA tenderness.  GU: *** External genitalia without erythema, lesions, or masses. No lymphadenopathy. Vaginal mucosa pink and moist without exudate, lesions, or ulcerations. Cervix pink without discharge. Cervical os closed. Uterus and adnexae palpable, not enlarged, and w/o tenderness. No palpable masses.  MSK: Muscle tone and strength appropriate  for age, w/o atrophy or abnormal movement.  EXTREMITIES: Active ROM intact, w/o tenderness, crepitus, or contracture. No obvious joint deformities or effusions. No clubbing, edema, or cyanosis.  NEUROLOGIC: CN's II-XII intact. Motor strength symmetrical with no obvious weakness. No sensory deficits. DTR's 2+ symmetric bilaterally. Steady, even gait.  PSYCH/MENTAL STATUS: Alert, oriented x 3. Cooperative, appropriate mood and affect.   Chaperoned by Angelyn Hollingsworth CMA ***  Depression and Anxiety Screen done today and results listed below:     12/24/2023    9:56 AM  Depression screen PHQ 2/9  Decreased Interest 0  Down, Depressed, Hopeless 0  PHQ - 2 Score 0  Altered sleeping 1  Tired, decreased energy 0  Change in appetite 0  Feeling bad or failure about yourself  1  Trouble concentrating 0  Moving slowly or fidgety/restless 0  PHQ-9 Score 2  Difficult doing work/chores  Somewhat difficult      12/24/2023    9:56 AM  GAD 7 : Generalized Anxiety Score  Nervous, Anxious, on Edge 1  Control/stop worrying 1  Worry too much - different things 1  Trouble relaxing 1  Restless 1  Easily annoyed or irritable 1  Afraid - awful might happen 0  Total GAD 7 Score 6  Anxiety Difficulty Somewhat difficult     Results for orders placed or performed during the hospital encounter of 01/31/24  Basic metabolic panel   Collection Time: 01/31/24  9:03 PM  Result Value Ref Range   Sodium 136 135 - 145 mmol/L   Potassium 3.9 3.5 - 5.1 mmol/L   Chloride 103 98 - 111 mmol/L   CO2 21 (L) 22 - 32 mmol/L   Glucose, Bld 390 (H) 70 - 99 mg/dL   BUN 6 6 - 20 mg/dL   Creatinine, Ser 9.33 0.44 - 1.00 mg/dL   Calcium  8.9 8.9 - 10.3 mg/dL   GFR, Estimated >39 >39 mL/min   Anion gap 12 5 - 15  CBC   Collection Time: 01/31/24  9:03 PM  Result Value Ref Range   WBC 5.8 4.0 - 10.5 K/uL   RBC 4.42 3.87 - 5.11 MIL/uL   Hemoglobin 12.5 12.0 - 15.0 g/dL   HCT 62.1 63.9 - 53.9 %   MCV 85.5 80.0 - 100.0 fL   MCH 28.3 26.0 - 34.0 pg   MCHC 33.1 30.0 - 36.0 g/dL   RDW 87.3 88.4 - 84.4 %   Platelets 315 150 - 400 K/uL   nRBC 0.0 0.0 - 0.2 %  hCG, serum, qualitative   Collection Time: 01/31/24  9:03 PM  Result Value Ref Range   Preg, Serum NEGATIVE NEGATIVE  Troponin I (High Sensitivity)   Collection Time: 01/31/24  9:03 PM  Result Value Ref Range   Troponin I (High Sensitivity) <2 <18 ng/L    Assessment & Plan:  There are no diagnoses linked to this encounter.  No orders of the defined types were placed in this encounter.   PATIENT COUNSELING:  - Encouraged a healthy well-balanced diet. Patient may adjust caloric intake to maintain or achieve ideal body weight. May reduce intake of dietary saturated fat and total fat and have adequate dietary potassium and calcium  preferably from fresh fruits, vegetables, and low-fat dairy products.   - Advised to avoid cigarette  smoking. - Discussed with the patient that most people either abstain from alcohol or drink within safe limits (<=14/week and <=4 drinks/occasion for males, <=7/weeks and <= 3 drinks/occasion for females) and that  the risk for alcohol disorders and other health effects rises proportionally with the number of drinks per week and how often a drinker exceeds daily limits. - Discussed cessation/primary prevention of drug use and availability of treatment for abuse.  - Discussed sexually transmitted diseases, avoidance of unintended pregnancy and contraceptive alternatives.  - Stressed the importance of regular exercise - Injury prevention: Discussed safety belts, safety helmets, smoke detector, smoking near bedding or upholstery.  - Dental health: Discussed importance of regular tooth brushing, flossing, and dental visits.   NEXT PREVENTATIVE PHYSICAL DUE IN 1 YEAR.  No follow-ups on file.  Rosina Senters, FNP

## 2024-02-12 ENCOUNTER — Ambulatory Visit (INDEPENDENT_AMBULATORY_CARE_PROVIDER_SITE_OTHER): Admitting: Internal Medicine

## 2024-02-12 ENCOUNTER — Encounter: Payer: Self-pay | Admitting: Internal Medicine

## 2024-02-12 VITALS — BP 102/68 | HR 86 | Temp 97.8°F | Ht 62.0 in | Wt 141.8 lb

## 2024-02-12 DIAGNOSIS — Z114 Encounter for screening for human immunodeficiency virus [HIV]: Secondary | ICD-10-CM | POA: Diagnosis not present

## 2024-02-12 DIAGNOSIS — Z1159 Encounter for screening for other viral diseases: Secondary | ICD-10-CM | POA: Diagnosis not present

## 2024-02-12 DIAGNOSIS — F411 Generalized anxiety disorder: Secondary | ICD-10-CM | POA: Diagnosis not present

## 2024-02-12 DIAGNOSIS — Z124 Encounter for screening for malignant neoplasm of cervix: Secondary | ICD-10-CM

## 2024-02-12 DIAGNOSIS — Z0000 Encounter for general adult medical examination without abnormal findings: Secondary | ICD-10-CM

## 2024-02-12 DIAGNOSIS — Z13220 Encounter for screening for lipoid disorders: Secondary | ICD-10-CM

## 2024-02-12 DIAGNOSIS — Z23 Encounter for immunization: Secondary | ICD-10-CM

## 2024-02-12 LAB — LIPID PANEL
Cholesterol: 175 mg/dL (ref 0–200)
HDL: 51 mg/dL (ref >39.00)
LDL Cholesterol: 112 mg/dL — ABNORMAL HIGH (ref 0–99)
NonHDL: 123.81
Total CHOL/HDL Ratio: 3
Triglycerides: 60 mg/dL (ref 0.0–149.0)
VLDL: 12 mg/dL (ref 0.0–40.0)

## 2024-02-12 MED ORDER — BUSPIRONE HCL 10 MG PO TABS: 10.0000 mg | ORAL_TABLET | Freq: Two times a day (BID) | ORAL | 2 refills | Status: DC | PRN

## 2024-02-12 NOTE — Patient Instructions (Signed)
 Anxiety:  Buspar  10mg  two times a day as needed for anxiety/panic attacks Hydroxyzine  at bedtime as needed for anxiety/sleep

## 2024-02-13 LAB — HIV ANTIBODY (ROUTINE TESTING W REFLEX): HIV 1&2 Ab, 4th Generation: NONREACTIVE

## 2024-02-13 LAB — HEPATITIS C ANTIBODY: Hepatitis C Ab: NONREACTIVE

## 2024-02-13 LAB — TSH RFX ON ABNORMAL TO FREE T4: TSH: 1.01 u[IU]/mL (ref 0.450–4.500)

## 2024-02-15 ENCOUNTER — Ambulatory Visit: Payer: Self-pay | Admitting: Internal Medicine

## 2024-03-01 ENCOUNTER — Ambulatory Visit: Admitting: Internal Medicine

## 2024-03-01 ENCOUNTER — Encounter: Payer: Self-pay | Admitting: Internal Medicine

## 2024-03-01 NOTE — Progress Notes (Deleted)
 Name: Angela Meyer  MRN/ DOB: [id number], 1996-05-22   Age/ Sex: 28 y.o., female    PCP: Billy Knee, FNP   Reason for Endocrinology Evaluation: Type 1 Diabetes Mellitus/Hyperthyroidism     Date of Initial Endocrinology Visit: 12/01/2022    PATIENT IDENTIFIER: Angela Meyer is a 28 y.o. female with a past medical history of DM. The patient presented for initial endocrinology clinic visit on 12/01/2022 for consultative assistance with her diabetes management.    HPI: Angela Meyer was    Diagnosed with DM at age 49 Prior Medications tried/Intolerance: She was on a pump early in her diagnosis until age 49                Hemoglobin A1c has ranged from 5.2% in 2012, peaking at 11.0% in 2024.   She already has a Dexcom and OmniPod at home , would like to get training on it  On her initial visit she had an A1c of 11.0%, she was on multiple daily injections of insulin , she already had OmniPods at home and was getting ready to start, we provided her with a correction scale   She was trained on the OmniPod use 07/14/2023  THYROID  HISTORY: Patient has been noted with hyperthyroid since 2008, thyroid  uptake and scan 04/2007 showed homogenous intense uptake of the thyroid  at 96% . Her most recent TSH was suppressed 0.01 u IU/mL on 04/11/2022, with elevated free T4 >5.5 NG/DL.   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 Beryl  She was trained on OmniPod use 06/2023, due to leakage of insulin  while using it    SUBJECTIVE:   During the last visit (10/30/2023): A1c 10.0%  Today (03/01/24): Angela Meyer is here for a follow-up on diabetes and thyroid  management.  She has not been checking glucose on regular basis  nor has she been using the insulin  pump   She presented to the ED with chest pain and shortness of breath, on 01/31/2024, workup unrevealing.    Continues nausea,  but no vomiting Has constipation  Denies local neck swelling  Denies palpitations   This patient with type 1 diabetes is treated with OmniPod (insulin  pump). During the visit the pump basal and bolus doses were reviewed including carb/insulin  rations and supplemental doses. The clinical list was updated. The glucose meter download was reviewed in detail to determine if the current pump settings are providing the best glycemic control without excessive hypoglycemia.  Pump and meter download:    Pump   OmniPod Settings   Insulin  type   Humalog    Basal rate       0000 0.8 u/h               I:C ratio       0000 1:8     Enter #10 g              Sensitivity       0000  35       Goal       0000  110           There is no height or weight on file to calculate BMI.  PUMP STATISTICS:     HOME DIABETES REGIMEN: Toujeo  38 units daily  Humalog  14 units TIDQAC CF: Humalog  (BG -130/35) Methimazole  10 mg, 2 tabs daily    Statin:  no ACE-I/ARB: no    DIABETIC COMPLICATIONS: Microvascular complications:  Denies: CKD Last eye exam: Completed 2024  Macrovascular complications:   Denies: CAD, PVD, CVA   PAST HISTORY: Past Medical History:  Past Medical History:  Diagnosis Date   Chronic autoimmune thyroiditis    Diabetes mellitus    Dyspepsia    Fetal tachycardia before the onset of labor    Goiter with hyperthyroidism    Hyperthyroidism    Hypothyroidism    Irritability    Irritability    Tachycardia    Thyroiditis, autoimmune    Thyrotoxicosis with diffuse goiter    Tremor    Tremor    Weight loss    Weight loss, abnormal    Past Surgical History:  Past Surgical History:  Procedure Laterality Date   CESAREAN SECTION N/A 12/16/2021   Procedure: CESAREAN SECTION;  Surgeon: Rendell Calton LABOR, DO;  Location: [facility] ORS;  Service: Obstetrics;  Laterality: N/A;   NO PAST SURGERIES      Social History:  reports that she has never smoked. She has never  used smokeless tobacco. She reports that she does not currently use alcohol. She reports that she does not use drugs. Family History:  Family History  Problem Relation Age of Onset   Diabetes Father    Congenital heart disease Father    Diabetes Paternal Aunt    Thyroid  disease Maternal Grandmother    Heart disease Maternal Grandfather      HOME MEDICATIONS: Allergies as of 03/01/2024   No Known Allergies      Medication List        Accurate as of March 01, 2024  7:04 AM. If you have any questions, ask your nurse or doctor.          busPIRone  10 MG tablet Commonly known as: BUSPAR  Take 1 tablet (10 mg total) by mouth 2 (two) times daily as needed (anxiety/panic attacks).   Dexcom G7 Sensor Misc 1 Device by Does not apply route as directed.   HumaLOG  KwikPen 200 UNIT/ML KwikPen Generic drug: insulin  lispro Inject 14-24 Units into the skin 3 (three) times daily.   hydrOXYzine  25 MG tablet Commonly known as: ATARAX  Take 1 tablet (25 mg total) by mouth every 6 (six) hours as needed for anxiety.   Insulin  Pen Needle 32G X 4 MM Misc 1 Device by Does not apply route in the morning, at noon, in the evening, and at bedtime.   methimazole  10 MG tablet Commonly known as: TAPAZOLE  Take 2 tablets (20 mg total) by mouth daily.   Toujeo  Max SoloStar 300 UNIT/ML Solostar Pen Generic drug: insulin  glargine (2 Unit Dial) Inject 34 Units into the skin at bedtime.         ALLERGIES: No Known Allergies   REVIEW OF SYSTEMS: A comprehensive ROS was conducted with the patient and is negative except as per HPI    OBJECTIVE:   VITAL SIGNS: LMP 02/05/2024    PHYSICAL EXAM:  General: Pt appears well and is in NAD  Neck: General: Supple without adenopathy or carotid bruits. Thyroid : Thyroid  gland is prominent, no buits  Lungs: Clear with good BS bilat   Heart: RRR   Extremities:  Lower extremities - No pretibial edema.   Neuro: MS is good with appropriate affect, pt  is alert and Ox3    DM foot exam: 10/30/2023  The skin of the feet is intact without sores or ulcerations. The pedal pulses are 2+ on right and 2+  on left. The sensation is intact to a screening 5.07, 10 gram monofilament bilaterally    DATA REVIEWED:  Lab Results  Component Value Date   HGBA1C 10.0 (A) 10/30/2023   HGBA1C 10.8 (A) 06/15/2023   HGBA1C 11.0 (A) 12/01/2022    Latest Reference Range & Units 01/31/24 21:03  Sodium 135 - 145 mmol/L 136  Potassium 3.5 - 5.1 mmol/L 3.9  Chloride 98 - 111 mmol/L 103  CO2 22 - 32 mmol/L 21 (L)  Glucose 70 - 99 mg/dL 609 (H)  BUN 6 - 20 mg/dL 6  Creatinine 9.55 - 8.99 mg/dL 9.33  Calcium  8.9 - 10.3 mg/dL 8.9  Anion gap 5 - 15  12  GFR, Estimated >60 mL/min >60     Latest Reference Range & Units 02/12/24 09:32  Total CHOL/HDL Ratio  3  Cholesterol 0 - 200 mg/dL 824  HDL Cholesterol >60.99 mg/dL 48.99  LDL (calc) 0 - 99 mg/dL 887 (H)  NonHDL  876.18  Triglycerides 0.0 - 149.0 mg/dL 39.9  VLDL 0.0 - 59.9 mg/dL 87.9     Latest Reference Range & Units 02/12/24 09:32  TSH 0.450 - 4.500 uIU/mL 1.010      Latest Reference Range & Units 06/15/23 10:41  Microalb, Ur mg/dL 1.9  MICROALB/CREAT RATIO <30 mg/g creat 11  Creatinine, Urine 20 - 275 mg/dL 829     In office BG 766 Mg/DL  ASSESSMENT / PLAN / RECOMMENDATIONS:   1) Type 1 Diabetes Mellitus, poorly controlled, Without complications - Most recent A1c of 10.0 %. Goal A1c < 7.0 %.    -Patient continues with poorly controlled diabetes - She tried the OmniPod pump but did not do well on it, as per patient it was leaking insulin  and keeps clicking - Patient will continue on multiple daily injections at this time - Her fasting BG this morning 173 Mg/DL, in office BG 766 Mg/DL this is 45 minutes after the meal - I will increase Toujeo  as below - A referral to ophthalmology has been placed  MEDICATIONS: Increase Toujeo  38 units daily Continue Humalog  14 units 3 times daily  before every meal Continue correction factor : Humalog  (BG -130/35) 3 times daily before every meal  EDUCATION / INSTRUCTIONS: BG monitoring instructions: Patient is instructed to check her blood sugars 3 times a day, before meals. Call [facility] if: BG persistently < 70  I reviewed the Rule of 15 for the treatment of hypoglycemia in detail with the patient. Literature supplied.   2) Diabetic complications:  Eye: Does not have known diabetic retinopathy.  Neuro/ Feet: Does not have known diabetic peripheral neuropathy. Renal: Patient does not have known baseline CKD. She is not on an ACEI/ARB at present.  3) Hyperthyroidism  -Patient is clinically euthyroid -No local neck symptoms -She has opted to remain on methimazole  - TFTs are normal  Medication  Continue  methimazole  10 mg, 2 tablet daily    4)Graves' Disease:  -No extrathyroidal manifestation of Graves' disease   Follow-up in 4 months      Signed electronically by: Stefano Redgie Butts, MD  [facility] Endocrinology  [facility] [address] [place] [place], [place] [address] Phone: [phone] FAX: [phone]   CC: Billy Knee, FNP [address] [place] [address] Phone: [phone]  Fax: [phone]    Return to Endocrinology clinic as below: Future Appointments  Date Time Provider Department Center  03/01/2024 11:30 AM Emmette Katt, Donell Redgie, MD [facility] None  03/22/2024  9:45 AM [place] Newman PARAS, MD [facility]  03/25/2024 10:20 AM Billy Knee, FNP [facility] [place]

## 2024-03-08 ENCOUNTER — Other Ambulatory Visit: Payer: Self-pay | Admitting: Internal Medicine

## 2024-03-08 DIAGNOSIS — F411 Generalized anxiety disorder: Secondary | ICD-10-CM

## 2024-03-22 ENCOUNTER — Ambulatory Visit: Attending: Cardiology | Admitting: Cardiology

## 2024-03-22 ENCOUNTER — Other Ambulatory Visit (HOSPITAL_COMMUNITY): Payer: Self-pay

## 2024-03-22 ENCOUNTER — Encounter: Payer: Self-pay | Admitting: Cardiology

## 2024-03-22 VITALS — BP 97/65 | HR 77 | Ht 62.0 in | Wt 144.0 lb

## 2024-03-22 DIAGNOSIS — R072 Precordial pain: Secondary | ICD-10-CM | POA: Insufficient documentation

## 2024-03-22 DIAGNOSIS — E1065 Type 1 diabetes mellitus with hyperglycemia: Secondary | ICD-10-CM | POA: Diagnosis not present

## 2024-03-22 DIAGNOSIS — E782 Mixed hyperlipidemia: Secondary | ICD-10-CM | POA: Insufficient documentation

## 2024-03-22 DIAGNOSIS — R0789 Other chest pain: Secondary | ICD-10-CM | POA: Diagnosis not present

## 2024-03-22 MED ORDER — ASPIRIN 81 MG PO TBEC
81.0000 mg | DELAYED_RELEASE_TABLET | Freq: Every day | ORAL | 3 refills | Status: AC
  Filled 2024-03-22: qty 30, 30d supply, fill #0

## 2024-03-22 MED ORDER — ROSUVASTATIN CALCIUM 10 MG PO TABS
10.0000 mg | ORAL_TABLET | Freq: Every day | ORAL | 1 refills | Status: AC
  Filled 2024-03-22: qty 30, 30d supply, fill #0

## 2024-03-22 MED ORDER — IVABRADINE HCL 7.5 MG PO TABS
15.0000 mg | ORAL_TABLET | Freq: Once | ORAL | 0 refills | Status: AC
  Filled 2024-03-22: qty 2, 1d supply, fill #0

## 2024-03-22 MED ORDER — NITROGLYCERIN 0.4 MG SL SUBL
0.4000 mg | SUBLINGUAL_TABLET | SUBLINGUAL | 3 refills | Status: AC | PRN
  Filled 2024-03-22: qty 25, 15d supply, fill #0

## 2024-03-22 NOTE — Patient Instructions (Addendum)
 Instructions:  START AS NEED FOR CHEST PAIN: Nitroglycerin START Crestor 10 mg daily  START Aspirin 81 mg   ON DAY OF CT:  ivabradine (CORLANOR) 7.5 MG TABS tablet         Take 2 tablets (15 mg total) by mouth once for 1 dose. Take 90-120 minutes prior to scan.    *If you need a refill on your cardiac medications before your next appointment, please call your pharmacy*  Lab Work: BMP TODAY  LIPID PANEL IN 3 MONTHS   If you have labs (blood work) drawn today and your tests are completely normal, you will receive your results only by: MyChart Message (if you have MyChart) OR A paper copy in the mail If you have any lab test that is abnormal or we need to change your treatment, we will call you to review the results.  Testing/Procedures: CORONARY CTA  Your physician has requested that you have cardiac CT. Cardiac computed tomography (CT) is a painless test that uses an x-ray machine to take clear, detailed pictures of your heart. For further information please visit [email or web address] Please follow instruction sheet as given.    Follow-Up: At [facility], you and your health needs are our priority.  As part of our continuing mission to provide you with exceptional heart care, our providers are all part of one team.  This team includes your primary Cardiologist (physician) and Advanced Practice Providers or APPs (Physician Assistants and Nurse Practitioners) who all work together to provide you with the care you need, when you need it.  Your next appointment:   06/2024   Provider:   Newman JINNY Lawrence, MD    We recommend signing up for the patient portal called MyChart.  Sign up information is provided on this After Visit Summary.  MyChart is used to connect with patients for Virtual Visits (Telemedicine).  Patients are able to view lab/test results, encounter notes, upcoming appointments, etc.  Non-urgent messages can be sent to your provider as well.   To  learn more about what you can do with MyChart, go to [email or web address].   Other Instructions    Your cardiac CT will be scheduled at one of the below locations:   [facility] [address]  [place], [place] [address] [phone]   If scheduled at the [facility] at [facility], please enter the parking lot using the [address] entrance and use the FREE valet service at the patient drop-off area. Enter the building and check-in with registration on the main floor.   Please follow these instructions carefully (unless otherwise directed):  An IV will be required for this test and Nitroglycerin will be given.  Hold all erectile dysfunction medications at least 3 days (72 hrs) prior to test. (Ie viagra, cialis, sildenafil, tadalafil, etc)   On the Night Before the Test: Be sure to Drink plenty of water. Do not consume any caffeinated/decaffeinated beverages or chocolate 12 hours prior to your test. Do not take any antihistamines 12 hours prior to your test.   On the Day of the Test: Drink plenty of water until 1 hour prior to the test. Do not eat any food 1 hour prior to test. You may take your regular medications prior to the test.  Take IVABRADINE two hours prior to test. If you take Furosemide/Hydrochlorothiazide/Spironolactone/Chlorthalidone, please HOLD on the morning of the test. Patients who wear a continuous glucose monitor MUST remove the device prior  to scanning. FEMALES- please wear underwire-free bra if available, avoid dresses & tight clothing       After the Test: Drink plenty of water. After receiving IV contrast, you may experience a mild flushed feeling. This is normal. On occasion, you may experience a mild rash up to 24 hours after the test. This is not dangerous. If this occurs, you can take Benadryl  25 mg, Zyrtec, Claritin , or Allegra and increase your fluid intake. (Patients taking Tikosyn should  avoid Benadryl , and may take Zyrtec, Claritin , or Allegra) If you experience trouble breathing, this can be serious. If it is severe call 911 IMMEDIATELY. If it is mild, please call our office.  We will call to schedule your test 2-4 weeks out understanding that some insurance companies will need an authorization prior to the service being performed.   For more information and frequently asked questions, please visit our website : [email or web address]  For non-scheduling related questions, please contact the cardiac imaging nurse navigator should you have any questions/concerns: Cardiac Imaging Nurse Navigators Direct Office Dial: [phone]   For scheduling needs, including cancellations and rescheduling, please call [place], [phone].

## 2024-03-22 NOTE — Progress Notes (Signed)
 Cardiology Office Note:  .   Date:  03/22/2024  ID:  Angela A Carvin, DOB 07-19-1995, MRN [id number] PCP: Billy Knee, FNP  [facility] Providers Cardiologist:  Newman Lawrence, MD PCP: Billy Knee, FNP  Chief Complaint  Patient presents with   Chest Pain     Angela Meyer is a 28 y.o. female with Graves' disease, insulin -dependent type 1 diabetes mellitus, chest pain  Discussed the use of AI scribe software for clinical note transcription with the patient, who gave verbal consent to proceed.  History of Present Illness Angela Meyer is a 28 year old female with type 1 diabetes who presents with chest pain.  She experiences sporadic chest pain from January to April, occurring at rest and lasting up to three hours. The pain is described as unbearable, with nitroglycerin providing immediate relief during a hospital visit. She denies chest pain, shortness of breath, palpitations, lightheadedness, or syncope during physical activity.  Her type 1 diabetes is currently managed with insulin  shots, including Toujeo  38 units and Humalog  14 units three times daily. She follows up with an endocrinologist and has an upcoming appointment.  She has hyperthyroidism, controlled with methimazole  10 mg twice daily, and recently had her thyroid  levels checked.      Vitals:   03/22/24 0939  BP: 97/65  Pulse: 77  SpO2: 95%      Review of Systems  Cardiovascular:  Positive for chest pain. Negative for dyspnea on exertion, leg swelling, palpitations and syncope.        Studies Reviewed: SABRA        EKG 03/22/2024: Normal sinus rhythm Normal ECG When compared with ECG of 31-Jan-2024 20:41, No significant change was found     Labs 01/2024: Chol 175, TG 60, HDL 51, LDL 112 HbA1C 10.0% Hb 12.5 Cr 0.6 TSH 1.1     Physical Exam Vitals and nursing note reviewed.  Constitutional:      General: She is not in acute distress. Neck:     Vascular: No JVD.   Cardiovascular:     Rate and Rhythm: Normal rate and regular rhythm.     Heart sounds: Normal heart sounds. No murmur heard. Pulmonary:     Effort: Pulmonary effort is normal.     Breath sounds: Normal breath sounds. No wheezing or rales.      VISIT DIAGNOSES:   ICD-10-CM   1. Precordial pain  R07.2 EKG 12-Lead    CT CORONARY MORPH W/CTA COR W/SCORE W/CA W/CM &/OR WO/CM    rosuvastatin (CRESTOR) 10 MG tablet    Basic metabolic panel with GFR    2. Mixed hyperlipidemia  E78.2 Lipid panel    Lipid panel    3. Type 1 diabetes mellitus with hyperglycemia (HCC)  E10.65        Angela A Mcgue is a 28 y.o. female with  h/o Graves' disease, insulin -dependent type 1 diabetes mellitus   Assessment & Plan Chest pain: Intermittent, relieved by nitroglycerin, possible cardiac etiology.  Risk factor includes uncontrolled type I type 2 diabetes mellitus. Recommend coronary CT angiogram. Until further workup is completed, recommend aspirin 81 mg daily, and sublingual Tikosyn as needed. Also, given her diabetes and mild elevated LDL, started Crestor 10 mg daily. Check lipid panel in 3 months.  Type 1 diabetes mellitus, uncontrolled: Uncontrolled type 1 diabetes managed with insulin  therapy. Increases risk of early heart disease. - Continue current insulin  regimen. - Emphasize importance of diabetes control to reduce heart disease risk.  Meds ordered this encounter  Medications   ivabradine (CORLANOR) 7.5 MG TABS tablet    Sig: Take 2 tablets (15 mg total) by mouth once for 1 dose. Take 90-120 minutes prior to scan.    Dispense:  2 tablet    Refill:  0   rosuvastatin (CRESTOR) 10 MG tablet    Sig: Take 1 tablet (10 mg total) by mouth daily.    Dispense:  90 tablet    Refill:  1   aspirin EC 81 MG tablet    Sig: Take 1 tablet (81 mg total) by mouth daily. Swallow whole.    Dispense:  90 tablet    Refill:  3   nitroGLYCERIN (NITROSTAT) 0.4 MG SL tablet    Sig: Place 1 tablet  (0.4 mg total) under the tongue every 5 (five) minutes as needed.    Dispense:  25 tablet    Refill:  3     F/u in 06/2024  Signed, Newman JINNY Lawrence, MD

## 2024-03-24 ENCOUNTER — Ambulatory Visit: Payer: Self-pay | Admitting: Cardiology

## 2024-03-24 LAB — BASIC METABOLIC PANEL WITH GFR
BUN/Creatinine Ratio: 20 (ref 9–23)
BUN: 14 mg/dL (ref 6–20)
CO2: 21 mmol/L (ref 20–29)
Calcium: 9.3 mg/dL (ref 8.7–10.2)
Chloride: 99 mmol/L (ref 96–106)
Creatinine, Ser: 0.71 mg/dL (ref 0.57–1.00)
Glucose: 229 mg/dL — ABNORMAL HIGH (ref 70–99)
Potassium: 4.5 mmol/L (ref 3.5–5.2)
Sodium: 137 mmol/L (ref 134–144)
eGFR: 117 mL/min/1.73 (ref >59)

## 2024-03-24 LAB — LIPID PANEL
Chol/HDL Ratio: 3.1 ratio (ref 0.0–4.4)
Cholesterol, Total: 185 mg/dL (ref 100–199)
HDL: 59 mg/dL (ref >39)
LDL Chol Calc (NIH): 113 mg/dL — ABNORMAL HIGH (ref 0–99)
Triglycerides: 68 mg/dL (ref 0–149)
VLDL Cholesterol Cal: 13 mg/dL (ref 5–40)

## 2024-03-25 ENCOUNTER — Ambulatory Visit: Admitting: Internal Medicine

## 2024-04-01 ENCOUNTER — Encounter (HOSPITAL_COMMUNITY): Payer: Self-pay

## 2024-04-04 ENCOUNTER — Ambulatory Visit (HOSPITAL_COMMUNITY)
Admission: RE | Admit: 2024-04-04 | Discharge: 2024-04-04 | Disposition: A | Discharge status: Home / Self Care | Source: Ambulatory Visit | Attending: Cardiovascular Disease | Admitting: Cardiovascular Disease

## 2024-04-04 DIAGNOSIS — R072 Precordial pain: Secondary | ICD-10-CM | POA: Insufficient documentation

## 2024-04-04 MED ORDER — IOHEXOL 350 MG/ML SOLN
100.0000 mL | Freq: Once | INTRAVENOUS | Status: AC | PRN
  Administered 2024-04-04: 100 mL via INTRAVENOUS

## 2024-04-04 MED ORDER — NITROGLYCERIN 0.4 MG SL SUBL
0.8000 mg | SUBLINGUAL_TABLET | Freq: Once | SUBLINGUAL | Status: AC
  Administered 2024-04-04: 0.8 mg via SUBLINGUAL

## 2024-07-08 ENCOUNTER — Other Ambulatory Visit

## 2024-07-08 ENCOUNTER — Ambulatory Visit: Admitting: Internal Medicine

## 2024-07-08 VITALS — BP 112/78 | Ht 62.0 in | Wt 144.0 lb

## 2024-07-08 DIAGNOSIS — E0590 Thyrotoxicosis, unspecified without thyrotoxic crisis or storm: Secondary | ICD-10-CM

## 2024-07-08 DIAGNOSIS — E1065 Type 1 diabetes mellitus with hyperglycemia: Secondary | ICD-10-CM | POA: Diagnosis not present

## 2024-07-08 DIAGNOSIS — E0500 Thyrotoxicosis with diffuse goiter without thyrotoxic crisis or storm: Secondary | ICD-10-CM | POA: Diagnosis not present

## 2024-07-08 LAB — TSH: TSH: 0.81 m[IU]/L

## 2024-07-08 LAB — POCT GLYCOSYLATED HEMOGLOBIN (HGB A1C): Hemoglobin A1C: 10.2 % — AB (ref 4.0–5.6)

## 2024-07-08 LAB — T4, FREE: Free T4: 0.9 ng/dL (ref 0.8–1.8)

## 2024-07-08 NOTE — Progress Notes (Signed)
 "  Name: Angela Meyer  MRN/ DOB: [id number], [birth year]   Age/ Sex: 29 y.o., female    PCP: Billy Knee, FNP   Reason for Endocrinology Evaluation: Type 1 Diabetes Mellitus/Hyperthyroidism     Date of Initial Endocrinology Visit: 12/01/2022    PATIENT IDENTIFIER: [name] is a 29 y.o. female with a past medical history of DM. The patient presented for initial endocrinology clinic visit on 12/01/2022 for consultative assistance with her diabetes management.    HPI: Ms. Wechter was    Diagnosed with DM at age 33 Prior Medications tried/Intolerance: She was on a pump early in her diagnosis until age 21                Hemoglobin A1c has ranged from 5.2% in 2012, peaking at 11.0% in 2024.   She already has a Dexcom and OmniPod at home , would like to get training on it  On her initial visit she had an A1c of 11.0%, she was on multiple daily injections of insulin , she already had OmniPods at home and was getting ready to start, we provided her with a correction scale   She was trained on the OmniPod use 07/14/2023, but by May, 2025 she had failed proper use of the pump and opted to return to using multiple daily injections of insulin      THYROID  HISTORY: Patient has been noted with hyperthyroid since 2008, thyroid  uptake and scan 04/2007 showed homogenous intense uptake of the thyroid  at 96% . Her most recent TSH was suppressed 0.01 u IU/mL on 04/11/2022, with elevated free T4 >5.5 NG/DL.   She has been on methimazole  intermittently , restarted 06/2022   She is a child psychotherapist  She is S/P delivery 2023    Transferred care from Dr. Beryl  She was trained on OmniPod use 06/2023, due to leakage of insulin  while using it    SUBJECTIVE:   During the last visit (10/30/2023): A1c 10.0%  Today (07/08/24): Angela Meyer is here for a follow-up on diabetes and thyroid  management.  She has not been checking glucose on regular basis, at times she checks 3x daily and at times  none.   She did not like using the CGM  She has not seen Dr. 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   HOME DIABETES REGIMEN: Toujeo  38 units daily - takes 30 units  Humalog  14 units TIDQAC CF: Humalog  (BG -130/35) Methimazole  10 mg, 2 tabs daily     GLUCOSE METER: Unable to download  30-day average 180 MGs/DL  22-645 mg/dL     Statin: no ACE-I/ARB: no    DIABETIC COMPLICATIONS: Microvascular complications:  Denies: CKD Last eye exam: Completed 2024  Macrovascular complications:   Denies: CAD, PVD, CVA   PAST HISTORY: Past Medical History:  Past Medical History:  Diagnosis Date   Chronic autoimmune thyroiditis    Diabetes mellitus    Dyspepsia    Fetal tachycardia before the onset of labor    Goiter with hyperthyroidism    Hyperthyroidism    Hypothyroidism    Irritability    Irritability    Tachycardia    Thyroiditis, autoimmune    Thyrotoxicosis with diffuse goiter    Tremor    Tremor    Weight loss    Weight loss, abnormal    Past Surgical History:  Past Surgical History:  Procedure Laterality Date  CESAREAN SECTION N/A 12/16/2021   Procedure: CESAREAN SECTION;  Surgeon: Rendell Ruby A, DO;  Location: [facility];  Service: Obstetrics;  Laterality: N/A;   NO PAST SURGERIES      Social History:  reports that she has never smoked. She has never used smokeless tobacco. She reports that she does not currently use alcohol. She reports that she does not use drugs. Family History:  Family History  Problem Relation Age of Onset   Diabetes Father    Congenital heart disease Father    Diabetes Paternal Aunt    Thyroid  disease Maternal Grandmother    Heart disease Maternal Grandfather      HOME MEDICATIONS: Allergies as of 07/08/2024   No Known Allergies      Medication List        Accurate as of July 08, 2024 10:42 AM. If you have any questions, ask your nurse or  doctor.          Aspirin  Low Dose 81 MG tablet Generic drug: aspirin  EC Take 1 tablet (81 mg total) by mouth daily. Swallow whole.   busPIRone  10 MG tablet Commonly known as: BUSPAR  TAKE 1 TABLET (10 MG TOTAL) BY MOUTH 2 (TWO) TIMES DAILY AS NEEDED (ANXIETY/PANIC ATTACKS).   Dexcom G7 Sensor Misc 1 Device by Does not apply route as directed.   HumaLOG  KwikPen 200 UNIT/ML KwikPen Generic drug: insulin  lispro Inject 14-24 Units into the skin 3 (three) times daily.   hydrOXYzine  25 MG tablet Commonly known as: ATARAX  Take 1 tablet (25 mg total) by mouth every 6 (six) hours as needed for anxiety.   Insulin  Pen Needle 32G X 4 MM Misc 1 Device by Does not apply route in the morning, at noon, in the evening, and at bedtime.   methimazole  10 MG tablet Commonly known as: TAPAZOLE  Take 2 tablets (20 mg total) by mouth daily.   nitroGLYCERIN  0.4 MG SL tablet Commonly known as: NITROSTAT  Place 1 tablet (0.4 mg total) under the tongue every 5 (five) minutes as needed.   rosuvastatin  10 MG tablet Commonly known as: CRESTOR  Take 1 tablet (10 mg total) by mouth daily.   Toujeo  Max SoloStar 300 UNIT/ML Solostar Pen Generic drug: insulin  glargine (2 Unit Dial) Inject 34 Units into the skin at bedtime.         ALLERGIES: No Known Allergies   REVIEW OF SYSTEMS: A comprehensive ROS was conducted with the patient and is negative except as per HPI    OBJECTIVE:   VITAL SIGNS: BP 112/78   Ht 5' 2 (1.575 m)   Wt 144 lb (65.3 kg)   BMI 26.34 kg/m    PHYSICAL EXAM:  General: Pt appears well and is in NAD  Neck: General: Supple without adenopathy or carotid bruits. Thyroid : Thyroid  gland is prominent, no buits  Lungs: Clear with good BS bilat   Heart: RRR   Extremities:  Lower extremities - No pretibial edema.   Neuro: MS is good with appropriate affect, pt is alert and Ox3    DM foot exam: 10/30/2023  The skin of the feet is intact without sores or ulcerations. The  pedal pulses are 2+ on right and 2+ on left. The sensation is intact to a screening 5.07, 10 gram monofilament bilaterally    DATA REVIEWED:  Lab Results  Component Value Date   HGBA1C 10.2 (A) 07/08/2024   HGBA1C 10.0 (A) 10/30/2023   HGBA1C 10.8 (A) 06/15/2023     Latest Reference Range &  Units 07/08/24 10:53  TSH mIU/L 0.81  T4,Free(Direct) 0.8 - 1.8 ng/dL 0.9      Latest Reference Range & Units 03/23/24 09:48  Sodium 134 - 144 mmol/L 137  Potassium 3.5 - 5.2 mmol/L 4.5  Chloride 96 - 106 mmol/L 99  CO2 20 - 29 mmol/L 21  Glucose 70 - 99 mg/dL 770 (H)  BUN 6 - 20 mg/dL 14  Creatinine 9.42 - 8.99 mg/dL 9.28  Calcium  8.7 - 10.2 mg/dL 9.3  BUN/Creatinine Ratio 9 - 23  20  eGFR >59 mL/min/1.73 117      Latest Reference Range & Units 03/23/24 09:49  Total CHOL/HDL Ratio 0.0 - 4.4 ratio 3.1  Cholesterol, Total 100 - 199 mg/dL 814  HDL Cholesterol >60 mg/dL 59  Triglycerides 0 - 850 mg/dL 68  VLDL Cholesterol Cal 5 - 40 mg/dL 13  LDL Chol Calc (NIH) 0 - 99 mg/dL 886 (H)      ASSESSMENT / PLAN / RECOMMENDATIONS:   1) Type 1 Diabetes Mellitus, poorly controlled, Without complications - Most recent A1c of 10.2 %. Goal A1c < 7.0 %.    -Patient continues with poorly controlled diabetes - She tried the OmniPod pump but did not do well on it, as per patient it was leaking insulin  and keeps clicking -She is not in favor of using CGM technology due to alarm alerts, I did advise the patient that she may silence these, but she does not believe she could do so -Limited glucose data with inconsistent glucose checks, emphasized the importance of checking glucose at least 3 times daily and the importance of reconsidering CGM technology -Patient has been on low glycemic excursions with BGs ranging from 77 to >350 .  -Patient endorses symptoms of hypoglycemia overnight, no BG readings, unable to verify if true hypoglycemia subjective given persistent high BG's -I did offer a  referral to RD for further discussion about insulin  use and low-carb diet -I did put in a referral to ophthalmology, she has not been able to make that appointment yet, despite calling her to schedule, due to work schedule. - I did advise the patient to take Humalog  5 5 units with a small meal or a snack  MEDICATIONS: Continue Toujeo  30 units daily Continue Humalog  14 units 3 times daily before every meal Take Humalog  5 units with snacks/small meal Continue correction factor : Humalog  (BG -130/35) 3 times daily before every meal  EDUCATION / INSTRUCTIONS: BG monitoring instructions: Patient is instructed to check her blood sugars 3 times a day, before meals. Call [facility] if: BG persistently < 70  I reviewed the Rule of 15 for the treatment of hypoglycemia in detail with the patient. Literature supplied.   2) Diabetic complications:  Eye: Does not have known diabetic retinopathy.  Neuro/ Feet: Does not have known diabetic peripheral neuropathy. Renal: Patient does not have known baseline CKD. She is not on an ACEI/ARB at present.  3) Hyperthyroidism  -Patient is clinically euthyroid - TFTs remain within normal range, no change  Medication  Continue  methimazole  10 mg, 2 tablet daily    4)Graves' Disease:  -No extrathyroidal manifestation of Graves' disease   Follow-up in 4 months  Patient encouraged to establish with PCP through  [facility] health care      Signed electronically by: Stefano Redgie Butts, MD  [facility] Endocrinology  [facility] [address] [place] [place], [place] [address] Phone: [phone] FAX: [phone]   CC: Billy Knee, FNP  [address] [place] [address] Phone: [phone]  Fax: [phone]    Return to Endocrinology clinic as below: No future appointments.      "

## 2024-07-08 NOTE — Patient Instructions (Addendum)
 Continue Toujeo  30 units daily Humalog  14 units with each meal Human log 5 units with a snack/small meal Humalog  correctional insulin : ADD extra units on insulin  to your meal-time Humalog  dose if your blood sugars are higher than 165. Use the scale below to help guide you:   Blood sugar before meal Number of units to inject  Less than 165 0 unit  166 -  200 1 units  201 -  235 2 units  236 -  270 3 units  271 -  305 4 units  306 -  340 5 units  341 -  375 6 units  376 -  410 7 units  411 -  445 8 units      HOW TO TREAT LOW BLOOD SUGARS (Blood sugar LESS THAN 70 MG/DL) Please follow the RULE OF 15 for the treatment of hypoglycemia treatment (when your (blood sugars are less than 70 mg/dL)   STEP 1: Take 15 grams of carbohydrates when your blood sugar is low, which includes:  3-4 GLUCOSE TABS  OR 3-4 OZ OF JUICE OR REGULAR SODA OR ONE TUBE OF GLUCOSE GEL    STEP 2: RECHECK blood sugar in 15 MINUTES STEP 3: If your blood sugar is still low at the 15 minute recheck --> then, go back to STEP 1 and treat AGAIN with another 15 grams of carbohydrates.

## 2024-07-11 ENCOUNTER — Ambulatory Visit: Payer: Self-pay | Admitting: Internal Medicine

## 2024-07-11 MED ORDER — METHIMAZOLE 10 MG PO TABS: 20.0000 mg | ORAL_TABLET | Freq: Every day | ORAL | 2 refills | Status: AC

## 2024-07-18 ENCOUNTER — Other Ambulatory Visit: Payer: Self-pay | Admitting: Internal Medicine

## 2024-10-03 ENCOUNTER — Ambulatory Visit: Admitting: Internal Medicine
